# Patient Record
Sex: Female | Born: 1935
Health system: Southern US, Community
[De-identification: ages and names within clinical notes are randomized; demographics above are authoritative.]

## PROBLEM LIST (undated history)

## (undated) DIAGNOSIS — Z860100 Personal history of colon polyps, unspecified: Secondary | ICD-10-CM

## (undated) DIAGNOSIS — Z9849 Cataract extraction status, unspecified eye: Secondary | ICD-10-CM

## (undated) DIAGNOSIS — I4891 Unspecified atrial fibrillation: Secondary | ICD-10-CM

## (undated) DIAGNOSIS — I499 Cardiac arrhythmia, unspecified: Secondary | ICD-10-CM

## (undated) DIAGNOSIS — R0602 Shortness of breath: Secondary | ICD-10-CM

## (undated) DIAGNOSIS — I219 Acute myocardial infarction, unspecified: Secondary | ICD-10-CM

## (undated) DIAGNOSIS — K5730 Diverticulosis of large intestine without perforation or abscess without bleeding: Secondary | ICD-10-CM

## (undated) HISTORY — DX: Personal history of colon polyps, unspecified: Z86.0100

## (undated) HISTORY — PX: EYE SURGERY: SHX253

## (undated) HISTORY — DX: Cataract extraction status, unspecified eye: Z98.49

## (undated) HISTORY — DX: Unspecified atrial fibrillation: I48.91

## (undated) HISTORY — PX: VEIN LIGATION: SHX2652

## (undated) HISTORY — DX: Diverticulosis of large intestine without perforation or abscess without bleeding: K57.30

## (undated) HISTORY — DX: Personal history of colonic polyps: Z86.010

---

## 2000-06-18 ENCOUNTER — Encounter: Payer: Self-pay | Admitting: Obstetrics and Gynecology

## 2000-06-18 ENCOUNTER — Encounter: Admission: RE | Admit: 2000-06-18 | Discharge: 2000-06-18 | Payer: Self-pay | Admitting: Obstetrics and Gynecology

## 2001-07-07 ENCOUNTER — Encounter (INDEPENDENT_AMBULATORY_CARE_PROVIDER_SITE_OTHER): Payer: Self-pay | Admitting: Specialist

## 2001-07-07 ENCOUNTER — Ambulatory Visit (HOSPITAL_COMMUNITY): Admission: RE | Admit: 2001-07-07 | Discharge: 2001-07-07 | Payer: Self-pay | Admitting: Obstetrics and Gynecology

## 2001-07-30 ENCOUNTER — Encounter: Payer: Self-pay | Admitting: Obstetrics and Gynecology

## 2001-07-30 ENCOUNTER — Encounter: Admission: RE | Admit: 2001-07-30 | Discharge: 2001-07-30 | Payer: Self-pay | Admitting: Obstetrics and Gynecology

## 2001-11-17 ENCOUNTER — Other Ambulatory Visit: Admission: RE | Admit: 2001-11-17 | Discharge: 2001-11-17 | Payer: Self-pay | Admitting: Obstetrics and Gynecology

## 2001-11-26 ENCOUNTER — Encounter: Payer: Self-pay | Admitting: Obstetrics and Gynecology

## 2001-11-26 ENCOUNTER — Encounter: Admission: RE | Admit: 2001-11-26 | Discharge: 2001-11-26 | Payer: Self-pay | Admitting: Obstetrics and Gynecology

## 2002-09-09 ENCOUNTER — Encounter: Payer: Self-pay | Admitting: Obstetrics and Gynecology

## 2002-09-09 ENCOUNTER — Encounter: Admission: RE | Admit: 2002-09-09 | Discharge: 2002-09-09 | Payer: Self-pay | Admitting: Obstetrics and Gynecology

## 2002-11-25 ENCOUNTER — Other Ambulatory Visit: Admission: RE | Admit: 2002-11-25 | Discharge: 2002-11-25 | Payer: Self-pay | Admitting: Obstetrics and Gynecology

## 2003-09-26 ENCOUNTER — Encounter: Admission: RE | Admit: 2003-09-26 | Discharge: 2003-09-26 | Payer: Self-pay | Admitting: Obstetrics and Gynecology

## 2003-12-22 ENCOUNTER — Other Ambulatory Visit: Admission: RE | Admit: 2003-12-22 | Discharge: 2003-12-22 | Payer: Self-pay | Admitting: Obstetrics and Gynecology

## 2003-12-27 ENCOUNTER — Ambulatory Visit (HOSPITAL_COMMUNITY): Admission: RE | Admit: 2003-12-27 | Discharge: 2003-12-27 | Payer: Self-pay | Admitting: Gastroenterology

## 2003-12-27 ENCOUNTER — Encounter (INDEPENDENT_AMBULATORY_CARE_PROVIDER_SITE_OTHER): Payer: Self-pay | Admitting: *Deleted

## 2004-10-30 ENCOUNTER — Encounter: Admission: RE | Admit: 2004-10-30 | Discharge: 2004-10-30 | Payer: Self-pay | Admitting: Obstetrics and Gynecology

## 2005-11-04 ENCOUNTER — Encounter: Admission: RE | Admit: 2005-11-04 | Discharge: 2005-11-04 | Payer: Self-pay | Admitting: Obstetrics and Gynecology

## 2006-01-09 ENCOUNTER — Other Ambulatory Visit: Admission: RE | Admit: 2006-01-09 | Discharge: 2006-01-09 | Payer: Self-pay | Admitting: Obstetrics and Gynecology

## 2006-11-28 ENCOUNTER — Encounter: Admission: RE | Admit: 2006-11-28 | Discharge: 2006-11-28 | Payer: Self-pay | Admitting: Obstetrics and Gynecology

## 2007-12-01 ENCOUNTER — Encounter: Admission: RE | Admit: 2007-12-01 | Discharge: 2007-12-01 | Payer: Self-pay | Admitting: Obstetrics and Gynecology

## 2008-01-20 ENCOUNTER — Other Ambulatory Visit: Admission: RE | Admit: 2008-01-20 | Discharge: 2008-01-20 | Payer: Self-pay | Admitting: Obstetrics and Gynecology

## 2009-01-24 ENCOUNTER — Encounter: Admission: RE | Admit: 2009-01-24 | Discharge: 2009-01-24 | Payer: Self-pay | Admitting: Obstetrics and Gynecology

## 2009-10-21 DIAGNOSIS — I219 Acute myocardial infarction, unspecified: Secondary | ICD-10-CM

## 2009-10-21 HISTORY — DX: Acute myocardial infarction, unspecified: I21.9

## 2010-02-13 ENCOUNTER — Encounter: Admission: RE | Admit: 2010-02-13 | Discharge: 2010-02-13 | Payer: Self-pay | Admitting: Gastroenterology

## 2010-05-17 ENCOUNTER — Other Ambulatory Visit: Admission: RE | Admit: 2010-05-17 | Discharge: 2010-05-17 | Payer: Self-pay | Admitting: Obstetrics and Gynecology

## 2010-09-03 ENCOUNTER — Inpatient Hospital Stay (HOSPITAL_COMMUNITY): Admission: EM | Admit: 2010-09-03 | Discharge: 2010-09-07 | Payer: Self-pay | Admitting: Emergency Medicine

## 2010-09-06 ENCOUNTER — Encounter (INDEPENDENT_AMBULATORY_CARE_PROVIDER_SITE_OTHER): Payer: Self-pay | Admitting: Interventional Cardiology

## 2010-09-27 ENCOUNTER — Encounter (HOSPITAL_COMMUNITY)
Admission: RE | Admit: 2010-09-27 | Discharge: 2010-11-20 | Payer: Self-pay | Source: Home / Self Care | Attending: Interventional Cardiology | Admitting: Interventional Cardiology

## 2010-11-21 ENCOUNTER — Encounter (HOSPITAL_COMMUNITY): Payer: Medicare Other | Attending: Interventional Cardiology

## 2010-11-21 DIAGNOSIS — I4891 Unspecified atrial fibrillation: Secondary | ICD-10-CM | POA: Insufficient documentation

## 2010-11-21 DIAGNOSIS — Z7982 Long term (current) use of aspirin: Secondary | ICD-10-CM | POA: Insufficient documentation

## 2010-11-21 DIAGNOSIS — I509 Heart failure, unspecified: Secondary | ICD-10-CM | POA: Insufficient documentation

## 2010-11-21 DIAGNOSIS — I2582 Chronic total occlusion of coronary artery: Secondary | ICD-10-CM | POA: Insufficient documentation

## 2010-11-21 DIAGNOSIS — I2510 Atherosclerotic heart disease of native coronary artery without angina pectoris: Secondary | ICD-10-CM | POA: Insufficient documentation

## 2010-11-21 DIAGNOSIS — I079 Rheumatic tricuspid valve disease, unspecified: Secondary | ICD-10-CM | POA: Insufficient documentation

## 2010-11-21 DIAGNOSIS — Z7902 Long term (current) use of antithrombotics/antiplatelets: Secondary | ICD-10-CM | POA: Insufficient documentation

## 2010-11-21 DIAGNOSIS — I214 Non-ST elevation (NSTEMI) myocardial infarction: Secondary | ICD-10-CM | POA: Insufficient documentation

## 2010-11-21 DIAGNOSIS — I059 Rheumatic mitral valve disease, unspecified: Secondary | ICD-10-CM | POA: Insufficient documentation

## 2010-11-21 DIAGNOSIS — Z9861 Coronary angioplasty status: Secondary | ICD-10-CM | POA: Insufficient documentation

## 2010-11-21 DIAGNOSIS — Z87891 Personal history of nicotine dependence: Secondary | ICD-10-CM | POA: Insufficient documentation

## 2010-11-21 DIAGNOSIS — Z5189 Encounter for other specified aftercare: Secondary | ICD-10-CM | POA: Insufficient documentation

## 2010-11-23 ENCOUNTER — Encounter (HOSPITAL_COMMUNITY): Payer: Medicare Other

## 2010-11-26 ENCOUNTER — Encounter (HOSPITAL_COMMUNITY): Payer: Medicare Other

## 2010-11-28 ENCOUNTER — Encounter (HOSPITAL_COMMUNITY): Payer: Medicare Other

## 2010-11-30 ENCOUNTER — Encounter (HOSPITAL_COMMUNITY): Payer: Medicare Other

## 2010-12-03 ENCOUNTER — Encounter (HOSPITAL_COMMUNITY): Payer: Medicare Other

## 2010-12-05 ENCOUNTER — Encounter (HOSPITAL_COMMUNITY): Payer: Medicare Other

## 2010-12-07 ENCOUNTER — Encounter (HOSPITAL_COMMUNITY): Payer: Medicare Other

## 2010-12-10 ENCOUNTER — Encounter (HOSPITAL_COMMUNITY): Payer: Medicare Other

## 2010-12-12 ENCOUNTER — Encounter (HOSPITAL_COMMUNITY): Payer: Medicare Other

## 2010-12-14 ENCOUNTER — Encounter (HOSPITAL_COMMUNITY): Payer: Medicare Other

## 2010-12-17 ENCOUNTER — Encounter (HOSPITAL_COMMUNITY): Payer: Medicare Other

## 2010-12-19 ENCOUNTER — Encounter (HOSPITAL_COMMUNITY): Payer: Medicare Other

## 2010-12-21 ENCOUNTER — Encounter (HOSPITAL_COMMUNITY): Payer: Medicare Other | Attending: Interventional Cardiology

## 2010-12-21 DIAGNOSIS — Z7902 Long term (current) use of antithrombotics/antiplatelets: Secondary | ICD-10-CM | POA: Insufficient documentation

## 2010-12-21 DIAGNOSIS — I214 Non-ST elevation (NSTEMI) myocardial infarction: Secondary | ICD-10-CM | POA: Insufficient documentation

## 2010-12-21 DIAGNOSIS — Z87891 Personal history of nicotine dependence: Secondary | ICD-10-CM | POA: Insufficient documentation

## 2010-12-21 DIAGNOSIS — Z9861 Coronary angioplasty status: Secondary | ICD-10-CM | POA: Insufficient documentation

## 2010-12-21 DIAGNOSIS — I2582 Chronic total occlusion of coronary artery: Secondary | ICD-10-CM | POA: Insufficient documentation

## 2010-12-21 DIAGNOSIS — I2510 Atherosclerotic heart disease of native coronary artery without angina pectoris: Secondary | ICD-10-CM | POA: Insufficient documentation

## 2010-12-21 DIAGNOSIS — Z5189 Encounter for other specified aftercare: Secondary | ICD-10-CM | POA: Insufficient documentation

## 2010-12-21 DIAGNOSIS — Z7982 Long term (current) use of aspirin: Secondary | ICD-10-CM | POA: Insufficient documentation

## 2010-12-21 DIAGNOSIS — I509 Heart failure, unspecified: Secondary | ICD-10-CM | POA: Insufficient documentation

## 2010-12-21 DIAGNOSIS — I4891 Unspecified atrial fibrillation: Secondary | ICD-10-CM | POA: Insufficient documentation

## 2010-12-21 DIAGNOSIS — I059 Rheumatic mitral valve disease, unspecified: Secondary | ICD-10-CM | POA: Insufficient documentation

## 2010-12-21 DIAGNOSIS — I079 Rheumatic tricuspid valve disease, unspecified: Secondary | ICD-10-CM | POA: Insufficient documentation

## 2010-12-24 ENCOUNTER — Encounter (HOSPITAL_COMMUNITY): Payer: Medicare Other

## 2010-12-26 ENCOUNTER — Encounter (HOSPITAL_COMMUNITY): Payer: Medicare Other

## 2010-12-28 ENCOUNTER — Encounter (HOSPITAL_COMMUNITY): Payer: Medicare Other

## 2010-12-31 ENCOUNTER — Encounter (HOSPITAL_COMMUNITY): Payer: Medicare Other

## 2011-01-01 LAB — HEMOGLOBIN A1C: Mean Plasma Glucose: 117 mg/dL — ABNORMAL HIGH (ref <117)

## 2011-01-01 LAB — POCT CARDIAC MARKERS
CKMB, poc: 1.9 ng/mL (ref 1.0–8.0)
Myoglobin, poc: 110 ng/mL (ref 12–200)

## 2011-01-01 LAB — BASIC METABOLIC PANEL
BUN: 15 mg/dL (ref 6–23)
BUN: 5 mg/dL — ABNORMAL LOW (ref 6–23)
BUN: 6 mg/dL (ref 6–23)
BUN: 6 mg/dL (ref 6–23)
CO2: 28 mEq/L (ref 19–32)
Calcium: 8.3 mg/dL — ABNORMAL LOW (ref 8.4–10.5)
Calcium: 8.7 mg/dL (ref 8.4–10.5)
Chloride: 104 mEq/L (ref 96–112)
Chloride: 105 mEq/L (ref 96–112)
Creatinine, Ser: 0.7 mg/dL (ref 0.4–1.2)
GFR calc Af Amer: >60 mL/min (ref >60)
GFR calc non Af Amer: >60 mL/min (ref >60)
GFR calc non Af Amer: >60 mL/min (ref >60)
Glucose, Bld: 103 mg/dL — ABNORMAL HIGH (ref 70–99)
Glucose, Bld: 112 mg/dL — ABNORMAL HIGH (ref 70–99)
Glucose, Bld: 121 mg/dL — ABNORMAL HIGH (ref 70–99)
Potassium: 3.3 mEq/L — ABNORMAL LOW (ref 3.5–5.1)
Potassium: 3.8 mEq/L (ref 3.5–5.1)
Potassium: 4.6 mEq/L (ref 3.5–5.1)

## 2011-01-01 LAB — PROTIME-INR
INR: 1.04 (ref 0.00–1.49)
Prothrombin Time: 13.8 seconds (ref 11.6–15.2)

## 2011-01-01 LAB — LIPID PANEL
HDL: 58 mg/dL (ref >39)
Total CHOL/HDL Ratio: 2.3 RATIO
Triglycerides: 67 mg/dL (ref <150)
VLDL: 13 mg/dL (ref 0–40)

## 2011-01-01 LAB — CARDIAC PANEL(CRET KIN+CKTOT+MB+TROPI)
CK, MB: 5.6 ng/mL — ABNORMAL HIGH (ref 0.3–4.0)
Relative Index: 15.3 — ABNORMAL HIGH (ref 0.0–2.5)
Relative Index: 2 (ref 0.0–2.5)
Relative Index: 6.7 — ABNORMAL HIGH (ref 0.0–2.5)
Total CK: 275 U/L — ABNORMAL HIGH (ref 7–177)
Total CK: 379 U/L — ABNORMAL HIGH (ref 7–177)
Troponin I: 5.49 ng/mL (ref 0.00–0.06)
Troponin I: 6.87 ng/mL (ref 0.00–0.06)

## 2011-01-01 LAB — D-DIMER, QUANTITATIVE: D-Dimer, Quant: 0.4 ug/mL-FEU (ref 0.00–0.48)

## 2011-01-01 LAB — CBC
HCT: 37.1 % (ref 36.0–46.0)
HCT: 38.3 % (ref 36.0–46.0)
HCT: 38.6 % (ref 36.0–46.0)
HCT: 38.9 % (ref 36.0–46.0)
HCT: 40.7 % (ref 36.0–46.0)
Hemoglobin: 13 g/dL (ref 12.0–15.0)
MCH: 31.1 pg (ref 26.0–34.0)
MCH: 31.6 pg (ref 26.0–34.0)
MCH: 32 pg (ref 26.0–34.0)
MCH: 32 pg (ref 26.0–34.0)
MCHC: 32.9 g/dL (ref 30.0–36.0)
MCHC: 33.9 g/dL (ref 30.0–36.0)
MCV: 93.9 fL (ref 78.0–100.0)
MCV: 94.3 fL (ref 78.0–100.0)
MCV: 94.6 fL (ref 78.0–100.0)
MCV: 95.3 fL (ref 78.0–100.0)
MCV: 95.8 fL (ref 78.0–100.0)
Platelets: 174 10*3/uL (ref 150–400)
Platelets: 176 10*3/uL (ref 150–400)
RBC: 4.08 MIL/uL (ref 3.87–5.11)
RBC: 4.25 MIL/uL (ref 3.87–5.11)
RDW: 13 % (ref 11.5–15.5)
RDW: 13.1 % (ref 11.5–15.5)
RDW: 13.2 % (ref 11.5–15.5)
RDW: 13.2 % (ref 11.5–15.5)
WBC: 12.2 10*3/uL — ABNORMAL HIGH (ref 4.0–10.5)

## 2011-01-01 LAB — COMPREHENSIVE METABOLIC PANEL
Albumin: 3.7 g/dL (ref 3.5–5.2)
BUN: 20 mg/dL (ref 6–23)
CO2: 28 mEq/L (ref 19–32)
Chloride: 102 mEq/L (ref 96–112)
Creatinine, Ser: 0.88 mg/dL (ref 0.4–1.2)
GFR calc non Af Amer: >60 mL/min (ref >60)
Glucose, Bld: 184 mg/dL — ABNORMAL HIGH (ref 70–99)
Total Bilirubin: 0.5 mg/dL (ref 0.3–1.2)

## 2011-01-01 LAB — BRAIN NATRIURETIC PEPTIDE
Pro B Natriuretic peptide (BNP): 413 pg/mL — ABNORMAL HIGH (ref 0.0–100.0)
Pro B Natriuretic peptide (BNP): 717 pg/mL — ABNORMAL HIGH (ref 0.0–100.0)

## 2011-01-01 LAB — MRSA PCR SCREENING
MRSA by PCR: NEGATIVE
MRSA by PCR: NEGATIVE

## 2011-01-02 ENCOUNTER — Encounter (HOSPITAL_COMMUNITY): Payer: Medicare Other

## 2011-01-04 ENCOUNTER — Encounter (HOSPITAL_COMMUNITY): Payer: Medicare Other

## 2011-01-07 ENCOUNTER — Encounter (HOSPITAL_COMMUNITY): Payer: Medicare Other

## 2011-01-09 ENCOUNTER — Encounter (HOSPITAL_COMMUNITY): Payer: Medicare Other

## 2011-01-11 ENCOUNTER — Encounter (HOSPITAL_COMMUNITY): Payer: Medicare Other

## 2011-01-14 ENCOUNTER — Encounter (HOSPITAL_COMMUNITY): Payer: Medicare Other

## 2011-01-16 ENCOUNTER — Encounter (HOSPITAL_COMMUNITY): Payer: Medicare Other

## 2011-01-18 ENCOUNTER — Encounter (HOSPITAL_COMMUNITY): Payer: Medicare Other

## 2011-01-21 ENCOUNTER — Encounter (HOSPITAL_COMMUNITY): Payer: Medicare Other

## 2011-01-23 ENCOUNTER — Encounter (HOSPITAL_COMMUNITY): Payer: Medicare Other

## 2011-01-25 ENCOUNTER — Encounter (HOSPITAL_COMMUNITY): Payer: Medicare Other

## 2011-01-28 ENCOUNTER — Encounter (HOSPITAL_COMMUNITY): Payer: Medicare Other

## 2011-01-30 ENCOUNTER — Encounter (HOSPITAL_COMMUNITY): Payer: Medicare Other

## 2011-02-01 ENCOUNTER — Encounter (HOSPITAL_COMMUNITY): Payer: Medicare Other

## 2011-02-04 ENCOUNTER — Encounter (HOSPITAL_COMMUNITY): Payer: Medicare Other

## 2011-02-06 ENCOUNTER — Encounter (HOSPITAL_COMMUNITY): Payer: Medicare Other

## 2011-02-08 ENCOUNTER — Encounter (HOSPITAL_COMMUNITY): Payer: Medicare Other

## 2011-02-11 ENCOUNTER — Encounter (HOSPITAL_COMMUNITY): Payer: Medicare Other

## 2011-03-08 NOTE — H&P (Signed)
[facility]  Patient:    [name] Diaz, [name] Diaz Visit Number: [id number] MRN: [id number]          Service Type: Attending:  Esmeralda Arthur, M.D. Dictated by:   Esmeralda Arthur, M.D. Adm. Date:  07/06/01     History and Physical  OFFICE CHART:  #[id number]  HISTORY OF PRESENT ILLNESS:   This is a 77 year old female, para 2 whowas admitted to the hospital for a hysteroscopy, resection, and polypectomy.  This patient was seen on June 15, 2001, stating that she had vaginal bleeding in the middle of the cycle in July and in August.  The patient takes Premarin 0.625 mg q.12h., and Provera 10 mg days 1 through 10, and has had no problem with this before.  She had an examination which waswithin normal limits.  We scheduled her for a sonohysterogram which was done on June 16, 2001.  It showed her uterus was 6.9 cm x 6.7 cm x 5.8 cm.  She had an endometrial stripe of 13 mm, felt to have a polyp.  She had fibroids 2.4 cm x 3.1 cm, 1.3 cm x 1.4 cm, and 1.8 cm x 1.5 cm.  Her right ovary was 1.8 cm x 1.8 cm x 1.6 cm. Her left ovary was not seen.  Because she had a thick endometrium, she had saline injected, and she had a polyp 19.0 mm x 10.0 mm, and it looked like two polyps side by side.  Because of this the patient is admitted to the hospital for surgery.  ALLERGIES:                    No known drug allergies.  PAST MEDICAL HISTORY:         The last Pap smear was in January 2002.  Last mammogram was in August 2001.  Her last period was July 02, 2001, and she has regular periods which are heavy.  She has had no hot flushes.  She has had asthma in the 1980s, none now.  She has gotten her vaccinations.  SOCIAL HISTORY:               She does drink coffee.  CURRENT MEDICATIONS:          1. Premarin 0.625 mg q.12h.      2. Provera days 1 through 10.                               3. Caltrate.  REVIEW OF SYSTEMS:            Noncontributory.  She has had no  heart or lung problems.  FAMILY HISTORY:               Her mother has had a stroke in the past.  There is no family history of breast, colon, or ovarian cancer.  She has an aunt and first cousin with osteoporosis.  PHYSICAL EXAMINATION:  GENERAL:                      A well-developed, well-nourished female, oriented and alert.  VITAL SIGNS:                  Blood pressure 116/61.  Weight 153 pounds. Height 5 feet 7-1/4 inches.  NECK:  Her thyroid isnot palpable.  HEART:                        Normal sinus rhythm without gallops or murmurs.  LUNGS:                        Clear to percussion and auscultation.  ABDOMEN:                      Liver is not enlarged.  Spleen is not enlarged.  PELVIC:                       Vagina: She has good support.  Cervix is epithelialized.  Uterus feels slightlyenlarged.  Adnexa reveal no masses. Perineum is within normal limits.  IMPRESSION:                   1. Postmenopausal bleeding.                        2. Endometrial polyp, large.         3. Endometrial fibroids.                               4. Hormonereplacement therapy.  DISPOSITION:                  She is admitted for a hysteroscopy, D&C, and endometrial resection.  Dictated by:  Esmeralda Arthur, M.D. Attending:  Esmeralda Arthur, M.D. DD:  07/06/01 TD:  07/06/01 Job: [id number]

## 2011-03-08 NOTE — Op Note (Signed)
[facility]  Patient:    [name] Diaz, [name] Diaz Visit Number: [id number] MRN: [id number]          Service Type: DSU Location: [facility] Attending Physician:  Amanda Cockayne Dictated by:   Esmeralda Arthur, M.D. Proc. Date: 07/07/01 Admit Date:  07/07/2001                             Operative Report  PREOPERATIVE DIAGNOSIS:  POSTOPERATIVE DIAGNOSIS:  OPERATION:  SURGEON:                      Esmeralda Arthur, M.D.  ANESTHESIA:                   General.  PACKS:                        None.  CATHETERS:                    None.  MEDIUM:                       Sorbitol, deficit 260 cc.  DESCRIPTION OF FINDINGS:      The uterus sounded to 8 cm. She was dilated to a #25 and we could see polyps and a fibroid and dilate her to a #33 with the resectoscope being able to see two fibroids and could see a polyp. After we removed the fibroid, she had a large posterior polyp. All were resected and she had no excessive bleeding.  DESCRIPTION OF PROCEDURE:     The patient was carried to the operating room. After satisfactory anesthesia, the patient was placed in the lithotomy position. She was prepped and draped in sterile field. The bladder was emptied by catheterization.  Examination reviewed the anterior and felt a little enlarged. No mass felt in the adnexa.  A weighted speculum was placed in the posterior vagina. Cervix was grasped with a tenaculum and the uterus sounded to 8 cm. We then dilated her to a #25 and put in the observation scope, and I could see what I thought was a fibroid and could see a polyp anteriorly. We then dilated her to a #33 with no difficulty, insert the resectoscope and you could see two fibroids and the anterior polyp. When we removed the two fibroids, she had a very large posterior polyp and we resected this, too.  The _______ was set on 190, cautery was 120. We used Sorbitol as a medium. We ended up with a deficit  of 260 cc. We had a deficit of 60 cc before we started resecting.  The patient then had cautery done, decreasing the pressure, then we watched the patient for two minutes. She had no excessive bleeding. The procedure was terminated. She was carried to the recovery room in good condition. Dictated by:   Esmeralda Arthur, M.D. Attending Physician:  Amanda Cockayne DD:  07/07/01 TD:  07/07/01 Job: [id number]

## 2011-03-13 ENCOUNTER — Other Ambulatory Visit: Payer: Self-pay | Admitting: Obstetrics and Gynecology

## 2011-03-13 DIAGNOSIS — Z1231 Encounter for screening mammogram for malignant neoplasm of breast: Secondary | ICD-10-CM

## 2011-03-25 ENCOUNTER — Ambulatory Visit
Admission: RE | Admit: 2011-03-25 | Discharge: 2011-03-25 | Disposition: A | Discharge status: Home / Self Care | Payer: Medicare Other | Source: Ambulatory Visit | Attending: Obstetrics and Gynecology | Admitting: Obstetrics and Gynecology

## 2011-03-25 DIAGNOSIS — Z1231 Encounter for screening mammogram for malignant neoplasm of breast: Secondary | ICD-10-CM

## 2011-05-23 ENCOUNTER — Other Ambulatory Visit: Payer: Self-pay | Admitting: Nurse Practitioner

## 2011-05-23 ENCOUNTER — Other Ambulatory Visit (HOSPITAL_COMMUNITY)
Admission: RE | Admit: 2011-05-23 | Discharge: 2011-05-23 | Disposition: A | Discharge status: Home / Self Care | Payer: Medicare Other | Source: Ambulatory Visit | Attending: Obstetrics and Gynecology | Admitting: Obstetrics and Gynecology

## 2011-05-23 DIAGNOSIS — Z1159 Encounter for screening for other viral diseases: Secondary | ICD-10-CM | POA: Insufficient documentation

## 2011-05-23 DIAGNOSIS — Z124 Encounter for screening for malignant neoplasm of cervix: Secondary | ICD-10-CM | POA: Insufficient documentation

## 2012-03-23 ENCOUNTER — Other Ambulatory Visit: Payer: Self-pay | Admitting: Obstetrics and Gynecology

## 2012-03-23 DIAGNOSIS — Z1231 Encounter for screening mammogram for malignant neoplasm of breast: Secondary | ICD-10-CM

## 2012-04-01 ENCOUNTER — Ambulatory Visit
Admission: RE | Admit: 2012-04-01 | Discharge: 2012-04-01 | Disposition: A | Discharge status: Home / Self Care | Payer: Medicare Other | Source: Ambulatory Visit | Attending: Obstetrics and Gynecology | Admitting: Obstetrics and Gynecology

## 2012-04-01 DIAGNOSIS — Z1231 Encounter for screening mammogram for malignant neoplasm of breast: Secondary | ICD-10-CM

## 2012-06-23 ENCOUNTER — Other Ambulatory Visit: Payer: Self-pay | Admitting: Nurse Practitioner

## 2012-08-11 ENCOUNTER — Other Ambulatory Visit: Payer: Self-pay | Admitting: Obstetrics and Gynecology

## 2012-08-14 ENCOUNTER — Encounter (HOSPITAL_COMMUNITY): Payer: Self-pay | Admitting: Pharmacy Technician

## 2012-08-14 ENCOUNTER — Encounter (HOSPITAL_COMMUNITY)
Admission: RE | Admit: 2012-08-14 | Discharge: 2012-08-14 | Disposition: A | Discharge status: Home / Self Care | Payer: Medicare Other | Source: Ambulatory Visit | Attending: Obstetrics and Gynecology | Admitting: Obstetrics and Gynecology

## 2012-08-14 ENCOUNTER — Encounter (HOSPITAL_COMMUNITY): Payer: Self-pay

## 2012-08-14 HISTORY — DX: Cardiac arrhythmia, unspecified: I49.9

## 2012-08-14 HISTORY — DX: Acute myocardial infarction, unspecified: I21.9

## 2012-08-14 HISTORY — DX: Shortness of breath: R06.02

## 2012-08-14 LAB — CBC
MCH: 31 pg (ref 26.0–34.0)
MCHC: 32.6 g/dL (ref 30.0–36.0)
Platelets: 187 10*3/uL (ref 150–400)

## 2012-08-14 LAB — BASIC METABOLIC PANEL
BUN: 20 mg/dL (ref 6–23)
Calcium: 9.9 mg/dL (ref 8.4–10.5)
GFR calc non Af Amer: 80 mL/min — ABNORMAL LOW (ref >90)
Glucose, Bld: 96 mg/dL (ref 70–99)

## 2012-08-14 NOTE — Patient Instructions (Addendum)
Your procedure is scheduled on:08/25/12  Enter through the Main Entrance at :10 am Pick up desk phone and dial [phone] and inform us of your arrival.  Please call [phone] if you have any problems the morning of surgery.  Remember: Do not eat or drink after midnight:Monday   Take these meds the morning of surgery with a sip of water: Metoprolol  DO NOT wear jewelry, eye make-up, lipstick,body lotion, or dark fingernail polish. Do not shave for 48 hours prior to surgery.   Patients discharged on the day of surgery will not be allowed to drive home.

## 2012-08-25 ENCOUNTER — Encounter (HOSPITAL_COMMUNITY)
Admission: RE | Disposition: A | Discharge status: Home / Self Care | Payer: Self-pay | Source: Ambulatory Visit | Attending: Obstetrics and Gynecology

## 2012-08-25 ENCOUNTER — Encounter (HOSPITAL_COMMUNITY): Payer: Self-pay | Admitting: Anesthesiology

## 2012-08-25 ENCOUNTER — Ambulatory Visit (HOSPITAL_COMMUNITY)
Admission: RE | Admit: 2012-08-25 | Discharge: 2012-08-25 | Disposition: A | Discharge status: Home / Self Care | Payer: Medicare Other | Source: Ambulatory Visit | Attending: Obstetrics and Gynecology | Admitting: Obstetrics and Gynecology

## 2012-08-25 ENCOUNTER — Ambulatory Visit (HOSPITAL_COMMUNITY): Payer: Medicare Other | Admitting: Anesthesiology

## 2012-08-25 DIAGNOSIS — Z01812 Encounter for preprocedural laboratory examination: Secondary | ICD-10-CM | POA: Insufficient documentation

## 2012-08-25 DIAGNOSIS — Z01818 Encounter for other preprocedural examination: Secondary | ICD-10-CM | POA: Insufficient documentation

## 2012-08-25 DIAGNOSIS — N840 Polyp of corpus uteri: Secondary | ICD-10-CM | POA: Insufficient documentation

## 2012-08-25 DIAGNOSIS — N950 Postmenopausal bleeding: Secondary | ICD-10-CM | POA: Insufficient documentation

## 2012-08-25 DIAGNOSIS — Q514 Unicornate uterus: Secondary | ICD-10-CM | POA: Insufficient documentation

## 2012-08-25 HISTORY — PX: HYSTEROSCOPY W/D&C: SHX1775

## 2012-08-25 SURGERY — DILATATION AND CURETTAGE /HYSTEROSCOPY
Anesthesia: General | Site: Vagina | Wound class: Clean Contaminated

## 2012-08-25 MED ORDER — MIDAZOLAM HCL 2 MG/2ML IJ SOLN
INTRAMUSCULAR | Status: AC
  Filled 2012-08-25: qty 2

## 2012-08-25 MED ORDER — FENTANYL CITRATE 0.05 MG/ML IJ SOLN
INTRAMUSCULAR | Status: AC
  Filled 2012-08-25: qty 5

## 2012-08-25 MED ORDER — PHENYLEPHRINE 40 MCG/ML (10ML) SYRINGE FOR IV PUSH (FOR BLOOD PRESSURE SUPPORT)
PREFILLED_SYRINGE | INTRAVENOUS | Status: AC
  Filled 2012-08-25: qty 10

## 2012-08-25 MED ORDER — IBUPROFEN 600 MG PO TABS: 600.0000 mg | ORAL_TABLET | Freq: Four times a day (QID) | ORAL | Status: DC | PRN

## 2012-08-25 MED ORDER — CEFAZOLIN SODIUM-DEXTROSE 2-3 GM-% IV SOLR
INTRAVENOUS | Status: AC
  Filled 2012-08-25: qty 50

## 2012-08-25 MED ORDER — LIDOCAINE HCL (CARDIAC) 20 MG/ML IV SOLN
INTRAVENOUS | Status: AC
  Filled 2012-08-25: qty 5

## 2012-08-25 MED ORDER — SODIUM CHLORIDE 0.9 % IR SOLN
Status: DC | PRN
  Administered 2012-08-25: 3000 mL

## 2012-08-25 MED ORDER — FENTANYL CITRATE 0.05 MG/ML IJ SOLN
INTRAMUSCULAR | Status: DC | PRN
  Administered 2012-08-25 (×2): 50 ug via INTRAVENOUS

## 2012-08-25 MED ORDER — KETOROLAC TROMETHAMINE 60 MG/2ML IM SOLN
INTRAMUSCULAR | Status: AC
  Filled 2012-08-25: qty 2

## 2012-08-25 MED ORDER — PROPOFOL 10 MG/ML IV EMUL
INTRAVENOUS | Status: DC | PRN
  Administered 2012-08-25: 100 mg via INTRAVENOUS

## 2012-08-25 MED ORDER — FENTANYL CITRATE 0.05 MG/ML IJ SOLN
INTRAMUSCULAR | Status: AC
  Filled 2012-08-25: qty 2

## 2012-08-25 MED ORDER — ONDANSETRON HCL 4 MG/2ML IJ SOLN
INTRAMUSCULAR | Status: AC
  Filled 2012-08-25: qty 2

## 2012-08-25 MED ORDER — LACTATED RINGERS IV SOLN
INTRAVENOUS | Status: DC
  Administered 2012-08-25 (×3): via INTRAVENOUS

## 2012-08-25 MED ORDER — DEXAMETHASONE SODIUM PHOSPHATE 10 MG/ML IJ SOLN
INTRAMUSCULAR | Status: AC
  Filled 2012-08-25: qty 1

## 2012-08-25 MED ORDER — GLYCOPYRROLATE 0.2 MG/ML IJ SOLN
INTRAMUSCULAR | Status: AC
  Filled 2012-08-25: qty 2

## 2012-08-25 MED ORDER — CEFAZOLIN SODIUM-DEXTROSE 2-3 GM-% IV SOLR
2.0000 g | INTRAVENOUS | Status: AC
  Administered 2012-08-25: 2 g via INTRAVENOUS

## 2012-08-25 MED ORDER — PROPOFOL 10 MG/ML IV EMUL
INTRAVENOUS | Status: AC
  Filled 2012-08-25: qty 20

## 2012-08-25 MED ORDER — MISOPROSTOL 200 MCG PO TABS: ORAL_TABLET | ORAL | Status: DC

## 2012-08-25 MED ORDER — FERRIC SUBSULFATE SOLN
Status: DC | PRN
  Administered 2012-08-25: 1

## 2012-08-25 MED ORDER — FENTANYL CITRATE 0.05 MG/ML IJ SOLN: 25.0000 ug | INTRAMUSCULAR | Status: DC | PRN

## 2012-08-25 MED ORDER — ONDANSETRON HCL 4 MG/2ML IJ SOLN
INTRAMUSCULAR | Status: DC | PRN
  Administered 2012-08-25: 4 mg via INTRAVENOUS

## 2012-08-25 MED ORDER — NEOSTIGMINE METHYLSULFATE 1 MG/ML IJ SOLN
INTRAMUSCULAR | Status: AC
  Filled 2012-08-25: qty 10

## 2012-08-25 MED ORDER — PHENYLEPHRINE HCL 10 MG/ML IJ SOLN
INTRAMUSCULAR | Status: DC | PRN
  Administered 2012-08-25 (×7): 40 ug via INTRAVENOUS

## 2012-08-25 MED ORDER — LIDOCAINE HCL (CARDIAC) 20 MG/ML IV SOLN
INTRAVENOUS | Status: DC | PRN
  Administered 2012-08-25: 50 mg via INTRAVENOUS

## 2012-08-25 MED ORDER — LIDOCAINE HCL 2 % IJ SOLN
INTRAMUSCULAR | Status: AC
  Filled 2012-08-25: qty 20

## 2012-08-25 SURGICAL SUPPLY — 17 items
BLADE INCISOR TRUC PLUS 2.9 (ABLATOR) ×1 IMPLANT
CANISTER SUCTION 2500CC (MISCELLANEOUS) ×2 IMPLANT
CATH ROBINSON RED A/P 16FR (CATHETERS) ×2 IMPLANT
CLOTH BEACON ORANGE TIMEOUT ST (SAFETY) ×2 IMPLANT
CONTAINER PREFILL 10% NBF 60ML (FORM) ×4 IMPLANT
DILATOR CANAL MILEX (MISCELLANEOUS) ×2 IMPLANT
DRESSING TELFA 8X3 (GAUZE/BANDAGES/DRESSINGS) ×2 IMPLANT
GLOVE BIO SURGEON STRL SZ7 (GLOVE) ×2 IMPLANT
GLOVE BIOGEL PI IND STRL 7.0 (GLOVE) ×3 IMPLANT
GLOVE BIOGEL PI INDICATOR 7.0 (GLOVE) ×3
GOWN STRL REIN XL XLG (GOWN DISPOSABLE) ×6 IMPLANT
INCISOR TRUC PLUS BLADE 2.9 (ABLATOR) ×2
KIT HYSTEROSCOPY TRUCLEAR (ABLATOR) ×2 IMPLANT
PACK HYSTEROSCOPY LF (CUSTOM PROCEDURE TRAY) ×2 IMPLANT
PAD OB MATERNITY 4.3X12.25 (PERSONAL CARE ITEMS) ×2 IMPLANT
TOWEL OR 17X24 6PK STRL BLUE (TOWEL DISPOSABLE) ×4 IMPLANT
WATER STERILE IRR 1000ML POUR (IV SOLUTION) ×2 IMPLANT

## 2012-08-25 NOTE — Discharge Instructions (Addendum)
Hysteroscopy Hysteroscopy is a procedure used for looking inside the womb (uterus). It may be done for many different reasons, including:  To evaluate abnormal bleeding, fibroid (benign, noncancerous) tumors, polyps, scar tissue (adhesions), and possibly cancer of the uterus.  To look for lumps (tumors) and other uterine growths.  To look for causes of why a woman cannot get pregnant (infertility), causes of recurrent loss of pregnancy (miscarriages), or a lost intrauterine device (IUD).  To perform a sterilization by blocking the fallopian tubes from inside the uterus. A hysteroscopy should be done right after a menstrual period to be sure you are not pregnant. LET YOUR CAREGIVER KNOW ABOUT:   Allergies.  Medicines taken, including herbs, eyedrops, over-the-counter medicines, and creams.  Use of steroids (by mouth or creams).  Previous problems with anesthetics or numbing medicines.  History of bleeding or blood problems.  History of blood clots.  Possibility of pregnancy, if this applies.  Previous surgery.  Other health problems. RISKS AND COMPLICATIONS   Putting a hole in the uterus.  Excessive bleeding.  Infection.  Damage to the cervix.  Injury to other organs.  Allergic reaction to medicines.  Too much fluid used in the uterus for the procedure. BEFORE THE PROCEDURE   Do not take aspirin or blood thinners for a week before the procedure, or as directed. It can cause bleeding.  Arrive at least 60 minutes before the procedure or as directed to read and sign the necessary forms.  Arrange for someone to take you home after the procedure.  If you smoke, do not smoke for 2 weeks before the procedure. PROCEDURE   Your caregiver may give you medicine to relax you. He or she may also give you a medicine that numbs the area around the cervix (local anesthetic) or a medicine that makes you sleep (general anesthesia).  Sometimes, a medicine is placed in the cervix  the day before the procedure. This medicine makes the cervix have a larger opening (dilate). This makes it easier for the instrument to be inserted into the uterus.  A small instrument (hysteroscope) is inserted through the vagina into the uterus. This instrument is similar to a pencil-sized telescope with a light.  During the procedure, air or a liquid is put into the uterus, which allows the surgeon to see better.  Sometimes, tissue is gently scraped from inside the uterus. These tissue samples are sent to a specialist who looks at tissue samples (pathologist). The pathologist will give a report to your caregiver. This will help your caregiver decide if further treatment is necessary. The report will also help your caregiver decide on the best treatment if the test comes back abnormal. AFTER THE PROCEDURE   If you had a general anesthetic, you may be groggy for a couple hours after the procedure.  If you had a local anesthetic, you will be advised to rest at the surgical center or caregiver's office until you are stable and feel ready to go home.  You may have some cramping for a couple days.  You may have bleeding, which varies from light spotting for a few days to menstrual-like bleeding for up to 3 to 7 days. This is normal.  Have someone take you home. FINDING OUT THE RESULTS OF YOUR TEST Not all test results are available during your visit. If your test results are not back during the visit, make an appointment with your caregiver to find out the results. Do not assume everything is normal if you  have not heard from your caregiver or the medical facility. It is important for you to follow up on all of your test results. HOME CARE INSTRUCTIONS   Do not drive for 24 hours or as instructed.  Only take over-the-counter or prescription medicines for pain, discomfort, or fever as directed by your caregiver.  Do not take aspirin. It can cause or aggravate bleeding.  Do not drive or drink  alcohol while taking pain medicine.  You may resume your usual diet.  Do not use tampons, douche, or have sexual intercourse for 2 weeks, or as advised by your caregiver.  Rest and sleep for the first 24 to 48 hours.  Take your temperature twice a day for 4 to 5 days. Write it down. Give these temperatures to your caregiver if they are abnormal (above 98.6 F or 37.0 C).  Take medicines your caregiver has ordered as directed.  Follow your caregiver's advice regarding diet, exercise, lifting, driving, and general activities.  Take showers instead of baths for 2 weeks, or as recommended by your caregiver.  If you develop constipation:  Take a mild laxative with the advice of your caregiver.  Eat bran foods.  Drink enough water and fluids to keep your urine clear or pale yellow.  Try to have someone with you or available to you for the first 24 to 48 hours, especially if you had a general anesthetic.  Make sure you and your family understand everything about your operation and recovery.  Follow your caregiver's advice regarding follow-up appointments and Pap smears. SEEK MEDICAL CARE IF:   You feel dizzy or lightheaded.  You feel sick to your stomach (nauseous).  You develop abnormal vaginal discharge.  You develop a rash.  You have an abnormal reaction or allergy to your medicine.  You need stronger pain medicine. SEEK IMMEDIATE MEDICAL CARE IF:   Bleeding is heavier than a normal menstrual period or you have blood clots.  You have an oral temperature above 102 F (38.9 C), not controlled by medicine.  You have increasing cramps or pains not relieved with medicine.  You develop belly (abdominal) pain that does not seem to be related to the same area of earlier cramping and pain.  You pass out.  You develop pain in the tops of your shoulders (shoulder strap areas).  You develop shortness of breath. MAKE SURE YOU:   Understand these instructions.  Will watch  your condition.  Will get help right away if you are not doing well or get worse. Document Released: 01/13/2001 Document Revised: 12/30/2011 Document Reviewed: 05/08/2009 [facility] Patient Information 2013 [place], [place]. DISCHARGE INSTRUCTIONS: HYSTEROSCOPY / ENDOMETRIAL ABLATION The following instructions have been prepared to help you care for yourself upon your return home.  Personal hygiene:  Use sanitary pads for vaginal drainage, not tampons.  Shower the day after your procedure.  NO tub baths, pools or Jacuzzis for 2-3 weeks.  Wipe front to back after using the bathroom.  Activity and limitations:  Do NOT drive or operate any equipment for 24 hours. The effects of anesthesia are still present and drowsiness may result.  Do NOT rest in bed all day.  Walking is encouraged.  Walk up and down stairs slowly.  You may resume your normal activity in one to two days or as indicated by your physician. Sexual activity: NO intercourse for at least 2 weeks after the procedure, or as indicated by your Doctor.  Diet: Eat a light meal as desired this evening.  You may resume your usual diet tomorrow.  Return to Work: You may resume your work activities in one to two days or as indicated by Therapist, sports.  What to expect after your surgery: Expect to have vaginal bleeding/discharge for 2-3 days and spotting for up to 10 days. It is not unusual to have soreness for up to 1-2 weeks. You may have a slight burning sensation when you urinate for the first day. Mild cramps may continue for a couple of days. You may have a regular period in 2-6 weeks.  Call your doctor for any of the following:  Excessive vaginal bleeding or clotting, saturating and changing one pad every hour.  Inability to urinate 6 hours after discharge from hospital.  Pain not relieved by pain medication.  Fever of 100.4 F or greater.  Unusual vaginal discharge or odor.  Return to office  _________________Call for an appointment ___________________ Patients signature: ______________________ Nurses signature ________________________  Post Anesthesia Care Unit [phone] DISCHARGE INSTRUCTIONS: HYSTEROSCOPY / ENDOMETRIAL ABLATION The following instructions have been prepared to help you care for yourself upon your return home.  Personal hygiene:  Use sanitary pads for vaginal drainage, not tampons.  Shower the day after your procedure.  NO tub baths, pools or Jacuzzis for 2-3 weeks.  Wipe front to back after using the bathroom.  Activity and limitations:  Do NOT drive or operate any equipment for 24 hours. The effects of anesthesia are still present and drowsiness may result.  Do NOT rest in bed all day.  Walking is encouraged.  Walk up and down stairs slowly.  You may resume your normal activity in one to two days or as indicated by your physician. Sexual activity: NO intercourse for at least 2 weeks after the procedure, or as indicated by your Doctor.  Diet: Eat a light meal as desired this evening. You may resume your usual diet tomorrow.  Return to Work: You may resume your work activities in one to two days or as indicated by Therapist, sports.  What to expect after your surgery: Expect to have vaginal bleeding/discharge for 2-3 days and spotting for up to 10 days. It is not unusual to have soreness for up to 1-2 weeks. You may have a slight burning sensation when you urinate for the first day. Mild cramps may continue for a couple of days. You may have a regular period in 2-6 weeks.  Call your doctor for any of the following:  Excessive vaginal bleeding or clotting, saturating and changing one pad every hour.  Inability to urinate 6 hours after discharge from hospital.  Pain not relieved by pain medication.  Fever of 100.4 F or greater.  Unusual vaginal discharge or odor.  Return to office _________________Call for an appointment  ___________________ Patients signature: ______________________ Nurses signature ________________________  Post Anesthesia Care Unit [phone]

## 2012-08-25 NOTE — Transfer of Care (Signed)
Immediate Anesthesia Transfer of Care Note  Patient: [name] Diaz  Procedure(s) Performed: Procedure(s) (LRB) with comments: DILATATION AND CURETTAGE /HYSTEROSCOPY (N/A) - TruClear  Patient Location: PACU  Anesthesia Type:General  Level of Consciousness: awake, oriented and patient cooperative  Airway & Oxygen Therapy: Patient Spontanous Breathing and Patient connected to nasal cannula oxygen  Post-op Assessment: Report given to PACU RN and Post -op Vital signs reviewed and stable  Post vital signs: Reviewed and stable  Complications: No apparent anesthesia complications

## 2012-08-25 NOTE — Anesthesia Preprocedure Evaluation (Addendum)
Anesthesia Evaluation  Patient identified by MRN, date of birth, ID band Patient awake    Reviewed: Allergy & Precautions, H&P , NPO status , Patient's Chart, lab work & pertinent test results, reviewed documented beta blocker date and time   History of Anesthesia Complications Negative for: history of anesthetic complications  Airway Mallampati: II TM Distance: <3 FB Neck ROM: full    Dental  (+) Teeth Intact,    Pulmonary shortness of breath and with exertion, former smoker (quit 11 years),  breath sounds clear to auscultation  Pulmonary exam normal       Cardiovascular Exercise Tolerance: Good + Past MI (2011) and + Cardiac Stents (bare metal stent 2011) + dysrhythmias (rate controlled on metoprolol.  stopped xarelto 2 days ago) Atrial Fibrillation Rhythm:irregular Rate:Normal     Neuro/Psych negative neurological ROS  negative psych ROS   GI/Hepatic negative GI ROS, Neg liver ROS,   Endo/Other  negative endocrine ROS  Renal/GU negative Renal ROS  Female GU complaint (polyp)     Musculoskeletal   Abdominal   Peds  Hematology negative hematology ROS (+)   Anesthesia Other Findings   Reproductive/Obstetrics negative OB ROS                          Anesthesia Physical Anesthesia Plan  ASA: III  Anesthesia Plan: General LMA   Post-op Pain Management:    Induction:   Airway Management Planned:   Additional Equipment:   Intra-op Plan:   Post-operative Plan:   Informed Consent: I have reviewed the patients History and Physical, chart, labs and discussed the procedure including the risks, benefits and alternatives for the proposed anesthesia with the patient or authorized representative who has indicated his/her understanding and acceptance.   Dental Advisory Given  Plan Discussed with: CRNA and Surgeon  Anesthesia Plan Comments:        Anesthesia Quick Evaluation

## 2012-08-25 NOTE — Brief Op Note (Signed)
08/25/2012  12:51 PM  PATIENT:  [name] Diaz  78 y.o. female  PRE-OPERATIVE DIAGNOSIS:  Post menopausal bleeding/Endometrial Mass  POST-OPERATIVE DIAGNOSIS:  Endometrial polyp, unicornuate uterus  PROCEDURE:  Procedure(s) (LRB) with comments: DILATATION AND CURETTAGE /HYSTEROSCOPY (N/A) - TruClear  SURGEON:  Surgeon(s) and Role:    * Geryl Rankins, MD - Primary  PHYSICIAN ASSISTANT:   ASSISTANTS: Technician   ANESTHESIA:   general  EBL:  Total I/O In: 1400 [I.V.:1400] Out: 170 [Urine:150; Blood:20]  BLOOD ADMINISTERED:none  DRAINS: none   LOCAL MEDICATIONS USED:  NONE  SPECIMEN:  Source of Specimen:  Endocervical and endometrial currettings, Endometrial polyp in Trueclear bag  DISPOSITION OF SPECIMEN:  PATHOLOGY  COUNTS:  YES  TOURNIQUET:  * No tourniquets in log *  DICTATION: .Other Dictation: Dictation Number [id number]  PLAN OF CARE: Discharge to home after PACU  PATIENT DISPOSITION:  PACU - hemodynamically stable.   Delay start of Pharmacological VTE agent (>24hrs) due to surgical blood loss or risk of bleeding: not applicable

## 2012-08-25 NOTE — Interval H&P Note (Signed)
History and Physical Interval Note:  08/25/2012 11:24 AM  Lind Covert  has presented today for surgery, with the diagnosis of Post menopausal bleeding/Endometrial Mass  The various methods of treatment have been discussed with the patient and family. After consideration of risks, benefits and other options for treatment, the patient has consented to  Procedure(s) (LRB) with comments: DILATATION AND CURETTAGE /HYSTEROSCOPY (N/A) - TruClear as a surgical intervention .  The patient's history has been reviewed, patient examined, no change in status, stable for surgery.  I have reviewed the patient's chart and labs.  Questions were answered to the patient's satisfaction.    Pt without any complaints.  No bleeding.   CV:  RRR  [name] Diaz

## 2012-08-25 NOTE — H&P (Signed)
08/19/2012  Reason for Appointment  1. Preop for 08/24/12   History of Present Illness  General:  78 y/o G2P2 presents for preop for Hyst/D&C and removal of endometrial mass on 08/25/2009. Pt initially presented with complaint of postmenopausal bleeding while on anticoagulant tx (Xarelto). Ultrasound showed a ~2 cm mass, most likely c/w an endometrial polyp. She had an EMB which was showed benign endometrial tissue but it was complicated by hemorrhage which was thought to be due to resumption of Xarelto a few days after the biopsy. She has completed a course of 30 days of Provera. She stopped Provera 2 weeks ago and has not had any bleeding since. Breaking out with skin lesions since June. Broke out on back most recently around the time she started Provera. Pt is followed by [facility]. They have given preop clearance after seeing pt and recommendations on establishing coag window. Will stop anticoagulant 2 days prior to surgery. Will resume 4-5 days after procedure.   Current Medications  Caltrate 600+D 600-400 MG-UNIT Tablet 1 tablet Twice a day  Centrum Silver Tablet Chewable 1 tablet Once a day  Nitroglycerin 0.4 mg 0.4 mg tablet 1 tablet as directed as directed prn chest pain. Take every 5 minutes, up to 3 doses, if pain does not go away call 911 or go to ER.  Xarelto 20 mg 20 mg tablet 1 tablet as directed  Simvastatin 20 MG Tablet TAKE 1 TABLET AT BEDTIME   Diazepam 5 MG Tablet 0.5 tablet as needed  Metoprolol Tartrate 100 MG Tablet TAKE 1 TABLET BY MOUTH TWICE A DAY   Provera 10 MG Tablet 1 tablet at bedtime for 30 days  Lasix 20 MG Tablet 1 tablet Once a day  Diprolene AF 0.05 % Cream 1 application a thin film to affected area Once a day  Medication List reviewed and reconciled with the patient   Past Medical History  Atrial fibrillation  History of colon polyps  Colonic diverticulosis  Remote cigarette smoking  Cataract surgery with lens implant  Varicose vein stripping  in 1967  Postmenopausal  normal surveillance colonoscopy February 13, 2007. Repeat surveillance colonoscopy April 2013.  08/2010 anterior MI with bare metal stent to LAD   Surgical History  Denies Past Surgical History   Family History  Father: deceased 45 yrs   Mother: deceased 68 yrs   Brother 1: alive   Brother2: alive   denies any GYN family cancer hx.   Social History  General:  History of smoking cigarettes: Former smoker.  no Smoking, quit in 1999;smoking hx 42yrs.  Alcohol: yes, occasionally.  Caffeine: yes.  no Recreational drug use.  Exercise: walks, daily.  Occupation: retired, Runner, broadcasting/film/video.  Marital Status: widowed in 08/2009.  Children: Boys, 2.  issues with son regarding her husband's death.   Gyn History  Sexual activity not currently sexually active.  Periods : postmenopausal.  Last pap smear date 05/23/11, all negative.  Last mammogram date 04/01/12.  Abnormal pap smear none.  Denies H/O STD .  Menarche 14 to 72.  Denies H/O Genetic history .    OB History  Number of pregnancies 2.  Pregnancy # 1 live birth, vaginal delivery, boy.  Pregnancy # 2 live birth, vaginal delivery, boy.    Allergies  Clindamycin HCl: bowel incontinence   Hospitalization/Major Diagnostic Procedure  childbirth x 2   MI, [facility] 08/2010    Vital Signs  Wt 159, Wt change -1 lb, Ht 66.5, BMI 25.28, Pulse sitting 75, BP sitting  112/62.   Physical Examination  GENERAL:  Patient appears alert and oriented.  General Appearance: well-appearing, well-developed, no acute distress.  Speech: clear.  LUNGS:  General clear bilaterally, no crackles, no wheezes.  HEART:  Heart sounds: RRR, normal.  ABDOMEN:  General: no masses tenderness or organomegaly, soft, non distended, non tender.  FEMALE GENITOURINARY:  General deferred.  EXTREMITIES:  General: No edema, no calf tenderness.     Assessments   1. Pre-op exam - V72.84 (Primary)   2. Postmenopausal bleeding - 627.1   3. AF  (atrial fibrillation) - 427.31, On anticoagulant.   Treatment  1. Pre-op exam  Proceed with hysteroscopy/D&C. R/B/A discussed at length with pt. MRI could be an alternative but could not obtain a tissue sample and would be less definitive. Pt understands risk of anesthesia to include MI, Stroke but given cardiac clearance obtained by her cardiologist feel comfortable preceding with this minimal procedure. Discussed risk of uterine perforation and possible hemorrahage during or after procedure. Pt clearly understands indication for procedure. All questions answered.    Follow Up  2 Weeks post op

## 2012-08-25 NOTE — Anesthesia Postprocedure Evaluation (Signed)
Anesthesia Post Note  Patient: [name] Diaz  Procedure(s) Performed: Procedure(s) (LRB): DILATATION AND CURETTAGE /HYSTEROSCOPY (N/A)  Anesthesia type: General  Patient location: PACU  Post pain: Pain level controlled  Post assessment: Post-op Vital signs reviewed  Last Vitals:  Filed Vitals:   08/25/12 1345  BP: 111/83  Pulse: 70  Temp: 36.6 C  Resp: 16    Post vital signs: Reviewed  Level of consciousness: sedated  Complications: No apparent anesthesia complications

## 2012-08-26 ENCOUNTER — Encounter (HOSPITAL_COMMUNITY): Payer: Self-pay | Admitting: Obstetrics and Gynecology

## 2012-08-26 NOTE — Op Note (Signed)
Diaz Diaz NO.:  [id number]  MEDICAL RECORD NO.:  [id number]  LOCATION:  [facility]                          FACILITY:  [facility]  PHYSICIAN:  Pieter Partridge, MD   DATE OF BIRTH:  1934-06-07  DATE OF PROCEDURE:  08/25/2012 DATE OF DISCHARGE:  08/25/2012                              OPERATIVE REPORT   PREOPERATIVE DIAGNOSIS:  Postmenopausal bleeding and endometrial mass.  POSTOPERATIVE DIAGNOSIS:  Endometrial polyp and unicornuate uterus.  PROCEDURES:  Hysteroscopy, D and C with resection of endometrial polyp via TRUCLEAR device.  SURGEON:  Pieter Partridge, MD  ASSISTANT:  Technician.  ANESTHESIA:  General.  IV FLUIDS:  1400.  URINE:  150 out prior to procedure.  BLOOD:  Minimal of 20.  LOCAL MEDICATIONS USED:  None.  SPECIMENS:  Endocervical and endometrial curettings and endometrial polyp in TRUCLEAR bag to Pathology.  DISPOSITION:  To PACU hemodynamically stable.  COMPLICATIONS:  None.  FINDINGS:  A large endometrial polyp that was pedunculated and appeared soft, occupied the majority of the endometrial cavity.  Endometrial cavity was narrow.  There was also either a cervical polyp or cervical cyst right near the internal os.  Only 1 ostia was visualized on the patient's left.  No right cornu was identified.  Atrophic mucosa at the end of the procedure, was mildly friable.  PROCEDURE IN DETAIL:  Diaz Diaz was taken to the operating room, placed in the dorsal supine position, where she underwent general endotracheal anesthesia without complication.  She was then placed in the dorsal lithotomy position and prepped and draped in a normal sterile fashion.  Graves speculum was inserted into the vagina.  The patient was then placed in Trendelenburg.  The anterior lip of the cervix was identified and grasped with the single-tooth tenaculum.  Lidocaine was not used to minimize puncture sites due to the patient's history of anticoagulation, she  will be resuming that in a few days.  The cervix was dilated up to a 17-French.  The hysteroscope was initially advanced and the os finder was used for additional dilatation. The hysteroscope was then advanced into the endometrial cavity.  I could not see past the polyp so the TRUCLEAR device was advanced and calibrated and activated.  The endometrial polyp was then successfully removed.  Once removed, I was able to see the remainder of the cavity and there was what appeared to be normal endometrium, but more than I would have anticipated given the patient's age and menopausal status and also that she had been on Provera for 30 days.  I used the TRUCLEAR device to shave down the endometrium.  The ostia was finally identified and again I could not see a right ostia.  The TRUCLEAR device and the hysteroscope was then removed and sharp curettage of all 4 quadrants of the uterus was performed delicately.  The hysteroscope was then advanced and no perforation noted and no active bleeding seen and again I could not identify a right tract, right horn.  So all instruments were removed from the cervix.  The single-tooth tenaculum site was seen but no bleeding noted, but due to her history of hemorrhage after  her coagulation was resumed, the areas were treated, painted with Monsel's. The cervical os was observed and there was no active bleeding or any oozing noted.  During the procedure, the inflow tubing dislodged twice and the fluid was displaced on the floor.  It was thought that the inflow tubing was being disconnected due to twisting of the tubing and that resolved.  The deficit was approximately 190.  Otherwise all technical or all devices operated without issue.  The patient had SCDs on prior to induction of anesthesia.  She received IV antibiotics prior to sedation.  All instrument, sponge, and needle counts were correct x3, and she was taken to the operating room with IV fluids  running.  Prior to the prep, I did do a bimanual exam to confirm an anteverted uterus approximately 8-9 weeks.  There did appear to be a little boil on the left in the suprapubic region left of midline.  It was slightly erythematous, slightly raised, but it appeared to be the beginnings of a boil.  Pictures were obtained of the procedure.     Pieter Partridge, MD     EBV/MEDQ  D:  08/25/2012  T:  08/26/2012  Job:  [id number]

## 2012-11-19 ENCOUNTER — Other Ambulatory Visit: Payer: Self-pay

## 2013-03-01 ENCOUNTER — Other Ambulatory Visit: Payer: Self-pay

## 2013-03-01 DIAGNOSIS — Z1231 Encounter for screening mammogram for malignant neoplasm of breast: Secondary | ICD-10-CM

## 2013-03-02 ENCOUNTER — Other Ambulatory Visit: Payer: Self-pay

## 2013-04-12 ENCOUNTER — Ambulatory Visit: Payer: Medicare Other

## 2013-05-04 ENCOUNTER — Ambulatory Visit: Payer: Medicare Other

## 2013-05-20 ENCOUNTER — Ambulatory Visit
Admission: RE | Admit: 2013-05-20 | Discharge: 2013-05-20 | Disposition: A | Discharge status: Home / Self Care | Payer: Medicare Other | Source: Ambulatory Visit

## 2013-05-20 DIAGNOSIS — Z1231 Encounter for screening mammogram for malignant neoplasm of breast: Secondary | ICD-10-CM

## 2013-07-13 ENCOUNTER — Encounter (INDEPENDENT_AMBULATORY_CARE_PROVIDER_SITE_OTHER): Payer: Medicare Other | Admitting: Ophthalmology

## 2013-07-13 DIAGNOSIS — H43819 Vitreous degeneration, unspecified eye: Secondary | ICD-10-CM

## 2013-07-13 DIAGNOSIS — H26499 Other secondary cataract, unspecified eye: Secondary | ICD-10-CM

## 2013-07-13 DIAGNOSIS — H3530 Unspecified macular degeneration: Secondary | ICD-10-CM

## 2013-07-20 ENCOUNTER — Encounter (INDEPENDENT_AMBULATORY_CARE_PROVIDER_SITE_OTHER): Payer: Medicare Other | Admitting: Ophthalmology

## 2013-07-20 DIAGNOSIS — H2700 Aphakia, unspecified eye: Secondary | ICD-10-CM

## 2013-08-09 ENCOUNTER — Other Ambulatory Visit: Payer: Self-pay | Admitting: Interventional Cardiology

## 2013-11-03 ENCOUNTER — Telehealth: Payer: Self-pay | Admitting: Interventional Cardiology

## 2013-11-03 NOTE — Telephone Encounter (Signed)
New message     Ins will no longer pay for xarelto-----Can she take something elso or can Dr V appeal to the ins company

## 2013-11-03 NOTE — Telephone Encounter (Signed)
Spoke with pt and insurance company will be faxing over PA today.

## 2013-11-03 NOTE — Telephone Encounter (Signed)
Kim have we received a PA for this pts Xarelto?

## 2013-11-09 NOTE — Progress Notes (Signed)
Patient was approved for xarelto through 1.14.16. ID # [id number]   GPI/NDC [id number]

## 2013-11-10 ENCOUNTER — Encounter: Payer: Self-pay | Admitting: *Deleted

## 2013-11-10 NOTE — Telephone Encounter (Signed)
This encounter was created in error - please disregard.

## 2013-11-11 NOTE — Telephone Encounter (Signed)
Completed and approved see Greta Doomose Jacobs note

## 2013-11-11 NOTE — Telephone Encounter (Signed)
lmom with update of approval

## 2013-11-11 NOTE — Telephone Encounter (Signed)
Kim, do you have PA on this pt. I don't mind doing the PA; I just need the form.

## 2013-11-14 ENCOUNTER — Other Ambulatory Visit: Payer: Self-pay | Admitting: *Deleted

## 2013-11-14 DIAGNOSIS — I4891 Unspecified atrial fibrillation: Secondary | ICD-10-CM

## 2013-11-14 DIAGNOSIS — Z79899 Other long term (current) drug therapy: Secondary | ICD-10-CM

## 2013-11-16 ENCOUNTER — Other Ambulatory Visit: Payer: Self-pay | Admitting: Pharmacist

## 2013-11-16 MED ORDER — RIVAROXABAN 20 MG PO TABS: 20.0000 mg | ORAL_TABLET | Freq: Every day | ORAL | Status: DC

## 2013-12-20 ENCOUNTER — Other Ambulatory Visit (INDEPENDENT_AMBULATORY_CARE_PROVIDER_SITE_OTHER): Payer: Medicare Other

## 2013-12-20 DIAGNOSIS — Z79899 Other long term (current) drug therapy: Secondary | ICD-10-CM

## 2013-12-20 DIAGNOSIS — I4891 Unspecified atrial fibrillation: Secondary | ICD-10-CM

## 2013-12-20 LAB — HEMOGLOBIN AND HEMATOCRIT, BLOOD
HEMATOCRIT: 40.2 % (ref 36.0–46.0)
Hemoglobin: 13.2 g/dL (ref 12.0–15.0)

## 2013-12-20 LAB — RENAL FUNCTION PANEL
ALBUMIN: 3.9 g/dL (ref 3.5–5.2)
BUN: 28 mg/dL — AB (ref 6–23)
CHLORIDE: 102 meq/L (ref 96–112)
CO2: 30 meq/L (ref 19–32)
Calcium: 9.7 mg/dL (ref 8.4–10.5)
Creatinine, Ser: 0.8 mg/dL (ref 0.4–1.2)
GFR: 78.32 mL/min (ref >60.00)
Glucose, Bld: 81 mg/dL (ref 70–99)
PHOSPHORUS: 3.8 mg/dL (ref 2.3–4.6)
POTASSIUM: 3.7 meq/L (ref 3.5–5.1)
Sodium: 140 mEq/L (ref 135–145)

## 2013-12-21 ENCOUNTER — Telehealth: Payer: Self-pay | Admitting: Cardiology

## 2013-12-21 DIAGNOSIS — I4891 Unspecified atrial fibrillation: Secondary | ICD-10-CM

## 2013-12-21 NOTE — Telephone Encounter (Signed)
Pt aware. 6 mth f/u lab scheduled.

## 2013-12-21 NOTE — Telephone Encounter (Signed)
Message copied by Theda SersSTEGALL, Koi Yarbro H on Tue Dec 21, 2013  3:41 PM ------      Message from: SMART, Gaspar SkeetersJEREMY G      Created: Mon Dec 20, 2013  5:16 PM       Continue Xarelto 20 mg qd. Recheck BMET and CBC in six months. ------

## 2014-03-29 ENCOUNTER — Ambulatory Visit (INDEPENDENT_AMBULATORY_CARE_PROVIDER_SITE_OTHER): Payer: Medicare Other | Admitting: Interventional Cardiology

## 2014-03-29 ENCOUNTER — Encounter (INDEPENDENT_AMBULATORY_CARE_PROVIDER_SITE_OTHER): Payer: Self-pay

## 2014-03-29 ENCOUNTER — Encounter: Payer: Self-pay | Admitting: Cardiology

## 2014-03-29 ENCOUNTER — Ambulatory Visit: Payer: Medicare Other | Admitting: Interventional Cardiology

## 2014-03-29 ENCOUNTER — Encounter: Payer: Self-pay | Admitting: Interventional Cardiology

## 2014-03-29 VITALS — BP 116/71 | HR 81 | Ht 67.5 in | Wt 161.2 lb

## 2014-03-29 DIAGNOSIS — R609 Edema, unspecified: Secondary | ICD-10-CM

## 2014-03-29 DIAGNOSIS — I5032 Chronic diastolic (congestive) heart failure: Secondary | ICD-10-CM | POA: Insufficient documentation

## 2014-03-29 DIAGNOSIS — I252 Old myocardial infarction: Secondary | ICD-10-CM | POA: Insufficient documentation

## 2014-03-29 DIAGNOSIS — I4891 Unspecified atrial fibrillation: Secondary | ICD-10-CM | POA: Insufficient documentation

## 2014-03-29 HISTORY — DX: Edema, unspecified: R60.9

## 2014-03-29 HISTORY — DX: Chronic diastolic (congestive) heart failure: I50.32

## 2014-03-29 HISTORY — DX: Old myocardial infarction: I25.2

## 2014-03-29 MED ORDER — NITROGLYCERIN 0.4 MG SL SUBL: 0.4000 mg | SUBLINGUAL_TABLET | SUBLINGUAL | Status: DC | PRN

## 2014-03-29 NOTE — Progress Notes (Signed)
Patient ID: [name] Diaz, female   DOB: April 09, 1934, 80 y.o.   MRN: [id number]    [address] [place], [place]  [address] Phone: [phone] Fax:  [phone]  Date:  03/29/2014   ID:  [name] Diaz, DOB Jul 03, 1934, MRN [id number]  PCP:  Charolett Bumpers, MD      History of Present Illness: [name] Diaz is a 80 y.o. female who has had AFib and MI. She has had a stroke secondary to atrial fibrillation in the past. She has several rashes which may be related to Provera and Xarelto. THis has improved. She follows with dermatology. Currently she feels well. No chest pain. Minimal shortness of breath but this does not limit her. No palpitations. No significant leg swelling on Lasix.    Chronic diastolic HF. Feels okay. Has some SHOB with exertion (walking up stairs or uphill) occasionally, no chest pain. Usually better if she stops for a second and rest. Denies dizziness, syncope, palpitations, SHOB, chest pain, orthopnea, PND. Has some LE edema. Took an extra Lasix couple times this year. Notices that if she drinks wine often, her feet would swell more. No bleeding.  Exercises with the Silver Sneakers 3 days a week w/o SHOB or chest pain.   Tolerated her GYN procedure to remove polyp.     Wt Readings from Last 3 Encounters:  03/29/14 161 lb 3.2 oz (73.12 kg)  08/12/12 158 lb (71.668 kg)  08/12/12 158 lb (71.668 kg)     Past Medical History  Diagnosis Date  . Myocardial infarction 2011    DES TO LAD  . Dysrhythmia     Atrial fib  . Shortness of breath     on exertion  . A-fib   . History of colon polyps   . Colonic diverticular disease   . History of cataract surgery     with lens implant     Current Outpatient Prescriptions  Medication Sig Dispense Refill  . Calcium Carbonate-Vitamin D (CALTRATE 600+D) 600-400 MG-UNIT per tablet Take 1 tablet by mouth 2 (two) times daily.      . Carboxymethylcellul-Glycerin 0.5-0.9 % SOLN Apply to eye once a week.      . diazepam  (VALIUM) 5 MG tablet Take 5 mg by mouth at bedtime as needed. For anxiety      . fish oil-omega-3 fatty acids 1000 MG capsule Take 3 g by mouth daily.      . furosemide (LASIX) 20 MG tablet TAKE 1 TABLET BY MOUTH EVERY DAY  30 tablet  6  . hydrocortisone cream 1 % Apply 1 application topically 2 (two) times daily as needed. For insect bites      . metoprolol (LOPRESSOR) 100 MG tablet Take 100 mg by mouth 2 (two) times daily.      . Multiple Vitamin (MULTIVITAMIN WITH MINERALS) TABS Take 1 tablet by mouth daily.      . nitroGLYCERIN (NITROSTAT) 0.4 MG SL tablet Place 1 tablet (0.4 mg total) under the tongue every 5 (five) minutes as needed for chest pain.  25 tablet  5  . Rivaroxaban (XARELTO) 20 MG TABS tablet Take 1 tablet (20 mg total) by mouth daily with supper.  30 tablet    . simvastatin (ZOCOR) 20 MG tablet Take 20 mg by mouth at bedtime.       No current facility-administered medications for this visit.    Allergies:    Allergies  Allergen Reactions  . Clindamycin/Lincomycin Diarrhea  Social History:  The patient  reports that she has quit smoking. She does not have any smokeless tobacco history on file. She reports that she does not drink alcohol or use illicit drugs.   Family History:  The patient's family history includes CVA in her mother.   ROS:  Please see the history of present illness.  No nausea, vomiting.  No fevers, chills.  No focal weakness.  No dysuria. Occasional lower extremity edema. Varicose veins.  All other systems reviewed and negative.   PHYSICAL EXAM: VS:  BP 116/71  Pulse 81  Ht 5' 7.5" (1.715 m)  Wt 161 lb 3.2 oz (73.12 kg)  BMI 24.86 kg/m2 Well nourished, well developed, in no acute distress HEENT: normal Neck: no JVD, no carotid bruits Cardiac:  normal S1, S2; irregularly irregular Lungs:  clear to auscultation bilaterally, no wheezing, rhonchi or rales Abd: soft, nontender, no hepatomegaly Ext: no edema Skin: warm and dry Neuro:   no focal  abnormalities noted  EKG:    Atrial fibrillation, rate controlled  ASSESSMENT AND PLAN:  Atrial fibrillation  Continue Metoprolol Tartrate Tablet, 100 MG, TAKE 1 TABLET BY MOUTH TWICE A DAY Notes: Rate controlled. Xarelto for stroke prevention. Discussed eliquis due to nosebleeds. No recent nosebleeds. WIll continue Xarelto for now.    2. Old myocardial infarction  Continue Simvastatin Tablet, 20 MG, TAKE 1 TABLET AT BEDTIME Continue Nitroglycerin 0.4 mg tablet, 0.4 mg, 1 tablet as directed, SL, as directed prn chest pain. Take every 5 minutes, up to 3 doses, if pain does not go away call 911 or go to ER. Notes: No CHF. DES in the LAD.    3. Chronic diastolic heart failure  Continue Lasix Tablet, 20 MG, 1 tablet, Orally, Once a day Notes: Likely caused by age and AFib. Now taking Lasix daily. She notices a difference if she skips a day.  LE edema: likely related to venous insufficiency.   Preventive Medicine  Adult topics discussed:  Diet: healthy diet.  Exercise: 5 days a week,at least 30 minutes of aerobic exercise.    Follow Up  1 Year (Reason: AFib, CAD)   .  Signed, Fredric MareJay S. Varanasi, MD, [facility] 03/29/2014 1:40 PM

## 2014-03-29 NOTE — Patient Instructions (Signed)
Your physician recommends that you continue on your current medications as directed. Please refer to the Current Medication list given to you today.  Your physician wants you to follow-up in: 1 year with Dr. Varanasi. You will receive a reminder letter in the mail two months in advance. If you don't receive a letter, please call our office to schedule the follow-up appointment.  

## 2014-04-07 ENCOUNTER — Other Ambulatory Visit: Payer: Self-pay

## 2014-04-07 ENCOUNTER — Telehealth: Payer: Self-pay

## 2014-04-07 MED ORDER — METOPROLOL TARTRATE 100 MG PO TABS: 100.0000 mg | ORAL_TABLET | Freq: Two times a day (BID) | ORAL | Status: DC

## 2014-04-07 MED ORDER — FUROSEMIDE 20 MG PO TABS: ORAL_TABLET | ORAL | Status: DC

## 2014-04-07 NOTE — Telephone Encounter (Signed)
Error

## 2014-04-14 ENCOUNTER — Other Ambulatory Visit: Payer: Self-pay

## 2014-04-14 DIAGNOSIS — Z1231 Encounter for screening mammogram for malignant neoplasm of breast: Secondary | ICD-10-CM

## 2014-04-26 ENCOUNTER — Other Ambulatory Visit: Payer: Self-pay | Admitting: *Deleted

## 2014-04-26 MED ORDER — RIVAROXABAN 20 MG PO TABS: 20.0000 mg | ORAL_TABLET | Freq: Every day | ORAL | Status: DC

## 2014-05-23 ENCOUNTER — Ambulatory Visit
Admission: RE | Admit: 2014-05-23 | Discharge: 2014-05-23 | Disposition: A | Discharge status: Home / Self Care | Payer: Medicare Other | Source: Ambulatory Visit

## 2014-05-23 DIAGNOSIS — Z1231 Encounter for screening mammogram for malignant neoplasm of breast: Secondary | ICD-10-CM

## 2014-06-02 ENCOUNTER — Telehealth: Payer: Self-pay | Admitting: Interventional Cardiology

## 2014-06-02 NOTE — Telephone Encounter (Signed)
Pt notified. Pt will call her PCP.

## 2014-06-02 NOTE — Telephone Encounter (Signed)
Jeremy, please advise.  

## 2014-06-02 NOTE — Telephone Encounter (Signed)
Should not be from metoprolol as she would have had this problem years ago.  Please notify patient.

## 2014-06-02 NOTE — Telephone Encounter (Signed)
New message      Pt has been on metoprolol for several years.  She has had diarrhea for 7-8 days straight.  Could this be caused by the medication?

## 2014-06-03 ENCOUNTER — Other Ambulatory Visit: Payer: Self-pay | Admitting: *Deleted

## 2014-06-03 MED ORDER — SIMVASTATIN 20 MG PO TABS: 20.0000 mg | ORAL_TABLET | Freq: Every day | ORAL | Status: DC

## 2014-06-24 ENCOUNTER — Other Ambulatory Visit: Payer: Medicare Other

## 2014-07-04 ENCOUNTER — Other Ambulatory Visit (INDEPENDENT_AMBULATORY_CARE_PROVIDER_SITE_OTHER): Payer: Medicare Other

## 2014-07-04 DIAGNOSIS — I4819 Other persistent atrial fibrillation: Secondary | ICD-10-CM

## 2014-07-04 DIAGNOSIS — I4891 Unspecified atrial fibrillation: Secondary | ICD-10-CM

## 2014-07-04 LAB — CBC
HCT: 41 % (ref 36.0–46.0)
HEMOGLOBIN: 13.5 g/dL (ref 12.0–15.0)
MCHC: 33 g/dL (ref 30.0–36.0)
MCV: 95.1 fl (ref 78.0–100.0)
PLATELETS: 153 10*3/uL (ref 150.0–400.0)
RBC: 4.31 Mil/uL (ref 3.87–5.11)
RDW: 14.7 % (ref 11.5–15.5)
WBC: 11.8 10*3/uL — AB (ref 4.0–10.5)

## 2014-07-04 LAB — BASIC METABOLIC PANEL
BUN: 19 mg/dL (ref 6–23)
CALCIUM: 9.4 mg/dL (ref 8.4–10.5)
CO2: 30 mEq/L (ref 19–32)
Chloride: 100 mEq/L (ref 96–112)
Creatinine, Ser: 0.8 mg/dL (ref 0.4–1.2)
GFR: 73.72 mL/min (ref >60.00)
Glucose, Bld: 92 mg/dL (ref 70–99)
POTASSIUM: 3.9 meq/L (ref 3.5–5.1)
SODIUM: 140 meq/L (ref 135–145)

## 2014-07-06 ENCOUNTER — Encounter: Payer: Self-pay | Admitting: Cardiology

## 2014-07-06 ENCOUNTER — Other Ambulatory Visit: Payer: Self-pay | Admitting: Cardiology

## 2014-07-06 DIAGNOSIS — Z7901 Long term (current) use of anticoagulants: Secondary | ICD-10-CM

## 2014-09-26 ENCOUNTER — Other Ambulatory Visit (HOSPITAL_COMMUNITY)
Admission: RE | Admit: 2014-09-26 | Discharge: 2014-09-26 | Disposition: A | Discharge status: Home / Self Care | Payer: Medicare Other | Source: Ambulatory Visit | Attending: Obstetrics and Gynecology | Admitting: Obstetrics and Gynecology

## 2014-09-26 ENCOUNTER — Other Ambulatory Visit: Payer: Self-pay | Admitting: Obstetrics and Gynecology

## 2014-09-26 DIAGNOSIS — Z1151 Encounter for screening for human papillomavirus (HPV): Secondary | ICD-10-CM | POA: Insufficient documentation

## 2014-09-26 DIAGNOSIS — Z124 Encounter for screening for malignant neoplasm of cervix: Secondary | ICD-10-CM | POA: Insufficient documentation

## 2014-09-27 LAB — CYTOLOGY - PAP

## 2014-10-07 ENCOUNTER — Other Ambulatory Visit: Payer: Self-pay

## 2014-10-07 DIAGNOSIS — I863 Vulval varices: Secondary | ICD-10-CM

## 2014-10-07 DIAGNOSIS — I8390 Asymptomatic varicose veins of unspecified lower extremity: Secondary | ICD-10-CM

## 2014-10-24 DIAGNOSIS — I83899 Varicose veins of unspecified lower extremities with other complications: Secondary | ICD-10-CM | POA: Insufficient documentation

## 2014-10-24 HISTORY — DX: Varicose veins of unspecified lower extremity with other complications: I83.899

## 2014-10-25 ENCOUNTER — Telehealth: Payer: Self-pay | Admitting: Interventional Cardiology

## 2014-10-25 NOTE — Telephone Encounter (Signed)
New message    Patient  pending vascular procedure - no date set .     Has general questions to ask the MD .

## 2014-10-25 NOTE — Telephone Encounter (Signed)
Patient has bleeding vulvar varicosities. She will be [facility]  [facility] Dr. Ave Filteragan for this. He will be doing a pelvic venography because he thinks she has a blockage in her pelvic veins. Sounds like they will be going through her subclavian vein.  She just wants you to know this because of her being on Xarelto. Doesn't think she needs to stop it.

## 2014-10-27 NOTE — Telephone Encounter (Signed)
Follow up    Patient calling back would like an opinion - upcoming procedure. Asking for a call back today.

## 2014-10-27 NOTE — Telephone Encounter (Signed)
Attempted to call the patient back. It's unclear from the previous note if the patient will need a clearance for the pending procedure. Will forward to Dr. Eldridge DaceVaranasi for review and to see if the patient would be ok to hold Xarelto if needed and if so, for how long.

## 2014-10-27 NOTE — Telephone Encounter (Signed)
The patient is aware of Dr. Hoyle BarrVaranasi's recommendations. She states the performing surgeon recommends holding Xarelto the night prior to her procedure and then the night of the procedure. I advised this is fine and that we typically leave it up to the surgeon to restart anticoagulation when they feel it is safe for the patient to do so. She voices understanding.

## 2014-10-27 NOTE — Telephone Encounter (Signed)
No further cardiac testing needed before procedure.  If MD performing procedure wants her off of Xarelto, it is ok to stop for 2 days prior.  I do not know the bleeding risks of the procedure so I would defer to the performing MDs opinion.

## 2014-11-02 ENCOUNTER — Other Ambulatory Visit: Payer: Self-pay | Admitting: Interventional Cardiology

## 2014-12-02 ENCOUNTER — Other Ambulatory Visit: Payer: Self-pay | Admitting: Interventional Cardiology

## 2014-12-05 ENCOUNTER — Other Ambulatory Visit: Payer: Self-pay | Admitting: Nurse Practitioner

## 2014-12-05 MED ORDER — SIMVASTATIN 20 MG PO TABS: 20.0000 mg | ORAL_TABLET | Freq: Every day | ORAL | Status: DC

## 2014-12-13 ENCOUNTER — Encounter (HOSPITAL_COMMUNITY): Payer: Medicare Other

## 2014-12-13 ENCOUNTER — Encounter: Payer: Medicare Other | Admitting: Vascular Surgery

## 2014-12-16 ENCOUNTER — Telehealth: Payer: Self-pay | Admitting: Interventional Cardiology

## 2014-12-16 NOTE — Telephone Encounter (Signed)
OK to increase lasix to 40 mg daily. BMet 1 week later

## 2014-12-16 NOTE — Telephone Encounter (Signed)
New message     Pt c/o medication issue:  1. Name of Medication: furosemide 2. How are you currently taking this medication (dosage and times per day)? 20mg  daily  3. Are you having a reaction (difficulty breathing--STAT)?  no  4. What is your medication issue?  Pt says her legs, ankles and fingers are still swelling.  She thinks she needs to take 40mg  daily.  Every now and then she takes 40mg s and it seems to work.  If ok to take 40mg ---will need [facility] pres[facility] [address]-----Ok to leave msg----pt lives alone.

## 2014-12-16 NOTE — Telephone Encounter (Signed)
Pt called and stated that she has noticed more swelling in both legs and ankles. Pt states that she has noticed this in the last two months and has gained 4-5lbs. Pt states that she is not having any shortness of breath.  Pt states that she use to be on Lasix 40mg  a day but it had been lowered to 20mg  daily. Pt wanted to increase her dose back to 40mg  a day if Dr. Eldridge DaceVaranasi was in agreement with this. Informed pt that Dr. Eldridge DaceVaranasi was out of the office today but that I would forward him this information to review and advisement. Pt verbalized understanding and was in agreement with this plan.

## 2014-12-16 NOTE — Telephone Encounter (Signed)
Left message to call back  

## 2014-12-19 MED ORDER — FUROSEMIDE 20 MG PO TABS: ORAL_TABLET | ORAL | Status: DC

## 2014-12-19 NOTE — Telephone Encounter (Signed)
I spoke with the patient. She is aware she may increase her lasix to 40 mg daily per Dr. Eldridge DaceVaranasi. She would like a refill sent in to the pharmacy for lasix 20 mg tablets so she can take 20 mg daily on the days her weight is controlled and she is not having any symptoms. She is already due for labs for Xarelto on 3/14.

## 2015-01-02 ENCOUNTER — Other Ambulatory Visit (INDEPENDENT_AMBULATORY_CARE_PROVIDER_SITE_OTHER): Payer: Medicare Other | Admitting: *Deleted

## 2015-01-02 DIAGNOSIS — Z7901 Long term (current) use of anticoagulants: Secondary | ICD-10-CM

## 2015-01-02 LAB — CBC
HEMATOCRIT: 43 % (ref 36.0–46.0)
Hemoglobin: 14.2 g/dL (ref 12.0–15.0)
MCHC: 32.9 g/dL (ref 30.0–36.0)
MCV: 92.7 fl (ref 78.0–100.0)
PLATELETS: 175 10*3/uL (ref 150.0–400.0)
RBC: 4.64 Mil/uL (ref 3.87–5.11)
RDW: 14.1 % (ref 11.5–15.5)
WBC: 13.3 10*3/uL — AB (ref 4.0–10.5)

## 2015-01-02 LAB — BASIC METABOLIC PANEL
BUN: 26 mg/dL — AB (ref 6–23)
CHLORIDE: 96 meq/L (ref 96–112)
CO2: 37 mEq/L — ABNORMAL HIGH (ref 19–32)
Calcium: 10 mg/dL (ref 8.4–10.5)
Creatinine, Ser: 0.82 mg/dL (ref 0.40–1.20)
GFR: 71.56 mL/min (ref >60.00)
GLUCOSE: 52 mg/dL — AB (ref 70–99)
POTASSIUM: 3.4 meq/L — AB (ref 3.5–5.1)
SODIUM: 137 meq/L (ref 135–145)

## 2015-01-02 NOTE — Addendum Note (Signed)
Addended by: Tonita PhoenixBOWDEN, Amery Minasyan K on: 01/02/2015 09:12 AM   Modules accepted: Orders

## 2015-03-27 ENCOUNTER — Other Ambulatory Visit: Payer: Self-pay | Admitting: Interventional Cardiology

## 2015-03-28 NOTE — Telephone Encounter (Signed)
Per note [phone]

## 2015-03-30 ENCOUNTER — Encounter: Payer: Self-pay | Admitting: Interventional Cardiology

## 2015-03-30 ENCOUNTER — Ambulatory Visit (INDEPENDENT_AMBULATORY_CARE_PROVIDER_SITE_OTHER): Payer: Medicare Other | Admitting: Interventional Cardiology

## 2015-03-30 VITALS — BP 110/60 | HR 83 | Ht 67.5 in | Wt 156.0 lb

## 2015-03-30 DIAGNOSIS — I482 Chronic atrial fibrillation, unspecified: Secondary | ICD-10-CM

## 2015-03-30 DIAGNOSIS — Z7901 Long term (current) use of anticoagulants: Secondary | ICD-10-CM

## 2015-03-30 DIAGNOSIS — I252 Old myocardial infarction: Secondary | ICD-10-CM | POA: Diagnosis not present

## 2015-03-30 DIAGNOSIS — I5032 Chronic diastolic (congestive) heart failure: Secondary | ICD-10-CM

## 2015-03-30 DIAGNOSIS — R609 Edema, unspecified: Secondary | ICD-10-CM | POA: Diagnosis not present

## 2015-03-30 HISTORY — DX: Long term (current) use of anticoagulants: Z79.01

## 2015-03-30 NOTE — Patient Instructions (Signed)
Medication Instructions:  None  Labwork: None  Testing/Procedures: None  Follow-Up: Your physician wants you to follow-up in: 1 year with Dr. Varanasi.  You will receive a reminder letter in the mail two months in advance. If you don't receive a letter, please call our office to schedule the follow-up appointment.    Any Other Special Instructions Will Be Listed Below (If Applicable).   

## 2015-03-30 NOTE — Progress Notes (Signed)
Patient ID: [name] Diaz, female   DOB: Jun 11, 1934, 81 y.o.   MRN: [id number]     Cardiology Office Note   Date:  03/30/2015   ID:  [name] Diaz, DOB 04-21-1934, MRN [id number]  PCP:  Charolett Bumpers, MD    No chief complaint on file. f/u CAD   Wt Readings from Last 3 Encounters:  03/30/15 156 lb (70.761 kg)  03/29/14 161 lb 3.2 oz (73.12 kg)  08/12/12 158 lb (71.668 kg)       History of Present Illness: [name] Diaz is a 81 y.o. female  who has had AFib and MI. She has had a stroke secondary to atrial fibrillation in the past. She had an LAD stent 2011.  She has several rashes which may be related to Provera and Xarelto. THis has improved. She follows with dermatology. Currently she feels well. No chest pain. Minimal shortness of breath but this does not limit her. No palpitations. No significant leg swelling on Lasix.   She had coils placed in veins in her vulva due to bleeding in Jan 2016.  THis has improved but her legs feels worse.  She still has occasional swelling and spider veins.  Worse by midday.    She is not going to go back to [facility] where the initial procedure was done.    She can get a rare ache in the chest.  UP to 2x/year.  It is mild and goes away on its own.  Lasts 5 minutes.  Not like the heart attack pain she had in the past.  She does not even think of using NTG when this happen.  It was not related to exertion.  Execise has been limited due to the GYN bleeding.  She can restart exercise at this time. She walks twice a day.    Past Medical History  Diagnosis Date  . Myocardial infarction 2011    DES TO LAD  . Dysrhythmia     Atrial fib  . Shortness of breath     on exertion  . A-fib   . History of colon polyps   . Colonic diverticular disease   . History of cataract surgery     with lens implant     Past Surgical History  Procedure Laterality Date  . Vein ligation  1960s  . Hysteroscopy w/d&c  08/25/2012    Procedure: DILATATION AND CURETTAGE  /HYSTEROSCOPY;  Surgeon: Geryl Rankins, MD;  Location: [facility];  Service: Gynecology;  Laterality: N/A;  TruClear     Current Outpatient Prescriptions  Medication Sig Dispense Refill  . Calcium Carbonate-Vitamin D (CALTRATE 600+D) 600-400 MG-UNIT per tablet Take 1 tablet by mouth daily.     . diazepam (VALIUM) 5 MG tablet Take 5 mg by mouth at bedtime as needed. For anxiety    . fish oil-omega-3 fatty acids 1000 MG capsule Take 3 g by mouth daily.    . furosemide (LASIX) 20 MG tablet Take two tablets (40 mg ) by mouth once daily as directed. 60 tablet 6  . hydrocortisone cream 1 % Apply 1 application topically 2 (two) times daily as needed. For insect bites    . metoprolol (LOPRESSOR) 100 MG tablet TAKE 1 TABLET (100 MG TOTAL) BY MOUTH 2 (TWO) TIMES DAILY. 60 tablet 6  . Multiple Vitamin (MULTIVITAMIN WITH MINERALS) TABS Take 1 tablet by mouth daily.    . nitroGLYCERIN (NITROSTAT) 0.4 MG SL tablet Place 1 tablet (0.4 mg total) under the tongue every  5 (five) minutes as needed for chest pain. 25 tablet 5  . simvastatin (ZOCOR) 20 MG tablet Take 1 tablet (20 mg total) by mouth at bedtime. 30 tablet 5  . XARELTO 20 MG TABS tablet TAKE 1 TABLET EVERY DAY WITH SUPPER 30 tablet 3   No current facility-administered medications for this visit.    Allergies:   Clindamycin/lincomycin    Social History:  The patient  reports that she has quit smoking. She does not have any smokeless tobacco history on file. She reports that she does not drink alcohol or use illicit drugs.   Family History:  The patient's *family history includes CVA in her mother.    ROS:  Please see the history of present illness.   Otherwise, review of systems are positive for DOE, stable.   All other systems are reviewed and negative.    PHYSICAL EXAM: VS:  BP 110/60 mmHg  Pulse 83  Ht 5' 7.5" (1.715 m)  Wt 156 lb (70.761 kg)  BMI 24.06 kg/m2 , BMI Body mass index is 24.06 kg/(m^2). GEN: Well nourished, well developed,  in no acute distress HEENT: normal Neck: no JVD, carotid bruits, or masses Cardiac: irregularly irregular; no murmurs, rubs, or gallops,tr edema  Respiratory:  clear to auscultation bilaterally, normal work of breathing GI: soft, nontender, nondistended, + BS MS: no deformity or atrophy Skin: warm and dry, no rash, spider veins, prominent varicose veins bilaterally Neuro:  Strength and sensation are intact Psych: euthymic mood, full affect   EKG:   The ekg ordered today demonstrates AFib, controlled rate, no ST segment changes   Recent Labs: 01/02/2015: BUN 26*; Creatinine, Ser 0.82; Hemoglobin 14.2; Platelets 175.0; Potassium 3.4*; Sodium 137   Lipid Panel    Component Value Date/Time   CHOL  09/05/2010 0420    131        ATP III CLASSIFICATION:  <200     mg/dL   Desirable  409-811  mg/dL   Borderline High  >=914    mg/dL   High          TRIG 67 09/05/2010 0420   HDL 58 09/05/2010 0420   CHOLHDL 2.3 09/05/2010 0420   VLDL 13 09/05/2010 0420   LDLCALC  09/05/2010 0420    60        Total Cholesterol/HDL:CHD Risk Coronary Heart Disease Risk Table                     Men   Women  1/2 Average Risk   3.4   3.3  Average Risk       5.0   4.4  2 X Average Risk   9.6   7.1  3 X Average Risk  23.4   11.0        Use the calculated Patient Ratio above and the CHD Risk Table to determine the patient's CHD Risk.        ATP III CLASSIFICATION (LDL):  <100     mg/dL   Optimal  782-956  mg/dL   Near or Above                    Optimal  130-159  mg/dL   Borderline  213-086  mg/dL   High  >578     mg/dL   Very High     Other studies Reviewed: Additional studies/ records that were reviewed today with results demonstrating: cath report reviewed .   ASSESSMENT AND PLAN:  Atrial fibrillation  Continue Metoprolol Tartrate Tablet, 100 MG, TAKE 1 TABLET BY MOUTH TWICE A DAY Notes: Rate controlled. Xarelto for stroke prevention. Discussed again eliquis due to nosebleeds. Still  does not want to switch at this point. Rare nosebleeds. WIll continue Xarelto for now. The TV commercials about Xarelto scare her as well.    2. Old myocardial infarction  Continue Simvastatin Tablet, 20 MG, TAKE 1 TABLET AT BEDTIME Continue Nitroglycerin 0.4 mg tablet, 0.4 mg, 1 tablet as directed, SL, as directed prn chest pain. Take every 5 minutes, up to 3 doses, if pain does not go away call 911 or go to ER. Notes: No CHF. BMS in the LAD, vision 2.75 x 18, postdilated to 3.3 mm.    3. Chronic diastolic heart failure  Increase Lasix Tablet, 20 MG, 2 tablet, Orally, Once a day Notes: Likely caused by age and AFib. Now taking Lasix daily. She notices a difference if she skips a day. LE edema: likely related to venous insufficiency. LE edema better with the higher dose  Of Lasix. She adjusts the dose of the Lasix as needed as well.         4. Venous insufficiency: COntinue to elevate her legs.  SHe will look into more compression stockings that are more comfortable.  She does not want to f/u with the interventional radiologist at [facility].  If she has worsening swelling, we can refer to Dr. Arbie Cookey.   Current medicines are reviewed at length with the patient today.  The patient concerns regarding her medicines were addressed.  The following changes have been made:  No change  Labs/ tests ordered today include:   Orders Placed This Encounter  Procedures  . EKG 12-Lead    Recommend 150 minutes/week of aerobic exercise Low fat, low carb, high fiber diet recommended  Disposition:   FU in 1 year   Delorise Jackson., MD  03/30/2015 4:24 PM    [facility] [address] [place], [place], [place]  [address] Phone: [phone]; Fax: [phone]

## 2015-04-10 ENCOUNTER — Encounter: Payer: Self-pay | Admitting: Interventional Cardiology

## 2015-05-02 ENCOUNTER — Other Ambulatory Visit: Payer: Self-pay

## 2015-05-02 DIAGNOSIS — Z1231 Encounter for screening mammogram for malignant neoplasm of breast: Secondary | ICD-10-CM

## 2015-05-25 ENCOUNTER — Ambulatory Visit
Admission: RE | Admit: 2015-05-25 | Discharge: 2015-05-25 | Disposition: A | Discharge status: Home / Self Care | Payer: Medicare Other | Source: Ambulatory Visit

## 2015-05-25 DIAGNOSIS — Z1231 Encounter for screening mammogram for malignant neoplasm of breast: Secondary | ICD-10-CM

## 2015-05-29 ENCOUNTER — Other Ambulatory Visit: Payer: Self-pay | Admitting: Interventional Cardiology

## 2015-05-29 ENCOUNTER — Other Ambulatory Visit: Payer: Self-pay | Admitting: Nurse Practitioner

## 2015-05-30 ENCOUNTER — Other Ambulatory Visit: Payer: Self-pay | Admitting: Gastroenterology

## 2015-05-30 DIAGNOSIS — I8390 Asymptomatic varicose veins of unspecified lower extremity: Secondary | ICD-10-CM

## 2015-06-20 ENCOUNTER — Encounter (INDEPENDENT_AMBULATORY_CARE_PROVIDER_SITE_OTHER): Payer: Self-pay

## 2015-06-20 ENCOUNTER — Ambulatory Visit
Admission: RE | Admit: 2015-06-20 | Discharge: 2015-06-20 | Disposition: A | Discharge status: Home / Self Care | Payer: Medicare Other | Source: Ambulatory Visit | Attending: Gastroenterology | Admitting: Gastroenterology

## 2015-06-20 DIAGNOSIS — I8390 Asymptomatic varicose veins of unspecified lower extremity: Secondary | ICD-10-CM

## 2015-06-20 NOTE — Consult Note (Signed)
Chief Complaint: Patient was seen in consultation today for  Chief Complaint  Patient presents with  . Advice Only    Bilateral Varicose Veins     at the request of Johnson,Martin K  Referring Physician(s): Johnson,Martin K  History of Present Illness: [name] Diaz is a 81 y.o. female with a long history of lower extremity varicose veins. She had bilateral GSD vein stripping back in 1966 due to severe bilateral pain, which relieved the symptoms at that time. She then developed bleeding vulvar veins, and was seen by IR in [place], and underwent left ovarian vein embolization for pelvic congestion syndrome and reflux in January 2016. She has had no further problems with vulvar varicosities. SHe states that her lower extremity pain reoccurred shortly after this procedure. She complains of bilateral lower extremity edema, pain, tiredness, skin color changes, spider and varicose veins. She's tried knee-high compression hose but these bind her about the calf, and she does not use them now. No history of venous ulceration, DVT, or PE. There is a history of varicose/spider veins in her mother. She is G2 P2. 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 She does not use any pain medications for the varicose/spider veins. She does feel that elevation helps significantly.  Past Medical History  Diagnosis Date  . Myocardial infarction 2011    DES TO LAD  . Dysrhythmia     Atrial fib  . Shortness of breath     on exertion  . A-fib   . History of colon polyps   . Colonic diverticular disease   . History of cataract surgery     with lens implant     Past Surgical History  Procedure Laterality Date  . Vein ligation  1960s  . Hysteroscopy w/d&c  08/25/2012    Procedure: DILATATION AND CURETTAGE /HYSTEROSCOPY;  Surgeon: Geryl Rankins, MD;  Location: [facility];  Service: Gynecology;  Laterality: N/A;  TruClear  . Eye surgery     Bilateral Cataract surgery w/ lens implant      Allergies: Clindamycin/lincomycin  Medications: Prior to Admission medications   Medication Sig Start Date End Date Taking? Authorizing Provider  Calcium Carbonate-Vitamin D (CALTRATE 600+D) 600-400 MG-UNIT per tablet Take 1 tablet by mouth daily.    Yes Historical Provider, MD  cycloSPORINE (RESTASIS) 0.05 % ophthalmic emulsion Place 1 drop into both eyes 2 (two) times daily.   Yes Historical Provider, MD  diazepam (VALIUM) 5 MG tablet Take 5 mg by mouth at bedtime as needed. For anxiety   Yes Historical Provider, MD  fish oil-omega-3 fatty acids 1000 MG capsule Take 3 g by mouth daily.   Yes Historical Provider, MD  furosemide (LASIX) 20 MG tablet Take two tablets (40 mg ) by mouth once daily as directed. 12/19/14  Yes Corky Crafts, MD  hydrocortisone cream 1 % Apply 1 application topically 2 (two) times daily as needed. For insect bites   Yes Historical Provider, MD  metoprolol (LOPRESSOR) 100 MG tablet TAKE 1 TABLET (100 MG TOTAL) BY MOUTH 2 (TWO) TIMES DAILY. 05/29/15  Yes Corky Crafts, MD  Multiple Vitamin (MULTIVITAMIN WITH MINERALS) TABS Take 1 tablet by mouth daily.   Yes Historical Provider, MD  nitroGLYCERIN (NITROSTAT) 0.4 MG SL tablet Place 1 tablet (0.4 mg total) under the tongue every 5 (five) minutes as needed for chest pain. 03/29/14  Yes Corky Crafts, MD  simvastatin (  ZOCOR) 20 MG tablet TAKE 1 TABLET (20 MG TOTAL) BY MOUTH AT BEDTIME. 05/29/15  Yes Rosalio Macadamia, NP  XARELTO 20 MG TABS tablet TAKE 1 TABLET EVERY DAY WITH SUPPER 03/28/15  Yes Corky Crafts, MD     Family History  Problem Relation Age of Onset  . CVA Mother     Social History   Social History  . Marital Status: Widowed    Spouse Name: N/A  . Number of Children: N/A  . Years of Education: N/A   Social History Main Topics  . Smoking status: Former Smoker -- 40 years  . Smokeless tobacco: Not on file  . Alcohol Use: No  . Drug  Use: No  . Sexual Activity: Not on file   Other Topics Concern  . Not on file   Social History Narrative    ECOG Status: 1 - Symptomatic but completely ambulatory  Review of Systems: A 12 point ROS discussed and pertinent positives are indicated in the HPI above.  All other systems are negative.  Review of Systems  Vital Signs: BP 137/81 mmHg  Pulse 87  Temp(Src) 97.7 F (36.5 C) (Oral)  Resp 15  Ht 5' 7.5" (1.715 m)  Wt 159 lb (72.122 kg)  BMI 24.52 kg/m2  SpO2 99%  Physical Exam  Constitutional: She appears well-developed and well-nourished. No distress.  HENT:  Head: Normocephalic and atraumatic.  Eyes: EOM are normal. Right eye exhibits no discharge. Left eye exhibits no discharge. No scleral icterus.  Neck: Normal range of motion.  Pulmonary/Chest: Effort normal. No stridor. No respiratory distress.  Abdominal: She exhibits no distension.  Musculoskeletal: Normal range of motion. She exhibits no tenderness.       Legs: Large bilateral leg varicose veins, blue and spider veins at foot/ankle.  Skin: Skin is warm and dry. No rash noted. She is not diaphoretic. No erythema.  Psychiatric: She has a normal mood and affect. Her behavior is normal. Judgment and thought content normal.    Mallampati Score:     Imaging: US Venous Img Lower Bilateral  06/20/2015   CLINICAL DATA:  Bilateral lower extremity varicose veins. Remote history of bilateral lower extremity vein stripping/ligation. Recent left ovarian vein embolization due to vulvar varicosities. Progressive bilateral lower extremity pain.  EXAM: BILATERAL LOWER EXTREMITY VENOUS DUPLEX ULTRASOUND  TECHNIQUE: Gray-scale sonography with graded compression, as well as color Doppler and duplex ultrasound, were performed to evaluate the deep and superficial veins of both lower extremities. Spectral Doppler was utilized to evaluate flow at rest and with distal augmentation maneuvers. A complete superficial venous  insufficiency exam was performed in the upright standing position. I personally performed the technical portion of the exam.  COMPARISON:  None.  FINDINGS: Deep Venous System:  Evaluation of the deep venous system including the common femoral, femoral, profunda femoral, popliteal and calf veins (where visible) demonstrate no evidence of deep venous thrombosis. The vessels are compressible and demonstrate normal respiratory phasicity and response to augmentation. No evidence of the deep venous reflux.  Superficial Venous System:  RIGHT  SFJ: Proximal ligation.  GSV Prox Thigh: Surgically absent  GSV Mid Thigh: Surgically absent  GSV Lower Thigh: Surgically absent  GSV at Knee: Surgically absent  GSV Prox Calf: Surgically absent  GSV Mid Calf: Surgically absent  GSV Distal Calf: Surgically absent  There is a large refluxing varicose vein extending from the right inguinal region through the anterior thigh and along the medial knee into the distal calf. This  communicates with the bulk of the large visible varicose veins of right lower extremity.  SPJ: Patent, nondilated  SSV Prox: Dilated without reflux  SSV Mid: Dilated with 3 seconds reflux  SSV Distal: Dilated, no significant reflux  LEFT  SFJ: Patent, dilated to a diameter of 9 mm, with the greater than 5 seconds reflux post augmentation.  GSV Prox Thigh: Surgically absent  GSV Mid Thigh: Surgically absent  GSV Lower Thigh: Surgically absent  GSV at Knee: Surgically absent  GSV Prox Calf: Surgically absent  GSV Mid Calf: Surgically absent  GSV Distal Calf: Surgically absent  Large varicose veins along the posterolateral distal thigh refluxing from a proximal branch it of the saphenofemoral junction.  SPJ: Not visualized  SSV Prox: Diminutive.  SSV Mid: mildly dilated, no reflux  SSV Distal: Dilated, no reflux  IMPRESSION: 1. Normal deep venous system of both lower extremities. 2. Surgical consistent with bilateral greater saphenous vein stripping/ ligation. 3.  Extensive large refluxing varicose veins in the right thigh, knee and calf, related to incompletely visualized pelvic supply. 4. Extensive large refluxing varicose veins in the left thigh and calf from an early proximal branch near the saphenofemoral junction.   Electronically Signed   By: Corlis Leak M.D.   On: 06/20/2015 16:27   Mm Digital Screening Bilateral  05/25/2015   CLINICAL DATA:  Screening.  EXAM: DIGITAL SCREENING BILATERAL MAMMOGRAM WITH CAD  COMPARISON:  Previous exam(s).  ACR Breast Density Category b: There are scattered areas of fibroglandular density.  FINDINGS: There are no findings suspicious for malignancy. Images were processed with CAD.  IMPRESSION: No mammographic evidence of malignancy. A result letter of this screening mammogram will be mailed directly to the patient.  RECOMMENDATION: Screening mammogram in one year. (Code:SM-B-01Y)  BI-RADS CATEGORY  1: Negative.   Electronically Signed   By: Dalphine Handing M.D.   On: 05/25/2015 17:21    Labs:  CBC:  Recent Labs  07/04/14 1005 01/02/15 0912  WBC 11.8* 13.3*  HGB 13.5 14.2  HCT 41.0 43.0  PLT 153.0 175.0    COAGS: No results for input(s): INR, APTT in the last 8760 hours.  BMP:  Recent Labs  07/04/14 1005 01/02/15 0912  NA 140 137  K 3.9 3.4*  CL 100 96  CO2 30 37*  GLUCOSE 92 52*  BUN 19 26*  CALCIUM 9.4 10.0  CREATININE 0.8 0.82    LIVER FUNCTION TESTS: No results for input(s): BILITOT, AST, ALT, ALKPHOS, PROT, ALBUMIN in the last 8760 hours.  TUMOR MARKERS: No results for input(s): AFPTM, CEA, CA199, CHROMGRNA in the last 8760 hours.  Assessment and Plan:  My impression is that this patient has symptomatic bilateral recurrent varicose veins post bilateral saphenous vein stripping. There is no underlying residual saphenous vein segment to approach for endovascular ablation. The large bilateral varicose veins would probably require combination of ambulatory phlebectomy and sclerotherapy for adequate  symptomatic resolution. I'll need to have her off her anticoagulation for optimized outcome with regards to the veins. We discussed the pathophysiology of varicose veins, natural history, treatment options including conservative treatment with compression hose, sclerotherapy, and ambulatory phlebectomy. We discussed time course of symptom resolution, possible risks and complications. I'm not sure why the ovarian vein embolization seems temporally related to exacerbation of her lower extremity symptoms, but nonetheless I think that a combination of ambulatory phlebectomy and sclerotherapy is indicated due to her current symptomatology, as well as her significant risk of progression of skin changes including  ulceration due to the fairly advanced nature of her current varicose veins. I gave her some literature to review. She seemed well-informed, and asked appropriate questions, which I answered. She knows to call with any additional questions. We need to check with her cardiologist Dr.Varanasi regarding appropriateness of holding anticoagulation around the time of treatment bilaterally. We can stagger her bilateral treatments by 1 month.  Thank you for this interesting consult.  I greatly enjoyed meeting MCKENIZE MEZERA and look forward to participating in their care.  A copy of this report was sent to the requesting provider on this date.  Signed: Sela Falk III, DAYNE Jeff Mccallum 06/20/2015, 4:43 PM   I spent a total of  60 Minutes   in face to face in clinical consultation, greater than 50% of which was counseling/coordinating care for symptomatic bilateral lower extremity varicose veins.

## 2015-06-22 ENCOUNTER — Telehealth: Payer: Self-pay | Admitting: Interventional Cardiology

## 2015-06-22 NOTE — Telephone Encounter (Signed)
Spoke with pt and informed her that Dr. Eldridge Dace advised against her being off of Xarelto for more than a few days at a time due to her stroke risk off of anticoags with Afib. Pt verbalized understanding and said that Dr. Everlene Farrier did plan to speak with Dr. Eldridge Dace and that she would await and see what conclusion they come to once they have spoken.

## 2015-06-22 NOTE — Telephone Encounter (Signed)
I would advise against being off of Xarelto for more than a few days at a time.  Her stroke risk off of anticoagulation is significant in the setting of AFib.

## 2015-06-22 NOTE — Telephone Encounter (Signed)
New message     Pt has questions regarding coming off of blood thinner for upcoming surgery Pt states surgeon has already requested surgical clearance

## 2015-06-22 NOTE — Telephone Encounter (Signed)
Spoke with pt and she was seen yesterday by Dr. Deanne Coffer at [facility]. Pt states that Dr. Deanne Coffer will be in contact with Dr. Eldridge Dace in regards to pt possibly getting sclerotherapy and a phlebectomy in bilateral legs. Pt states that she wanted to call to make Dr. Eldridge Dace and wanted to get his opinion on something Dr. Deanne Coffer said in regards to anitcoags interfering with procedure. Pt states Dr. Deanne Coffer would like for pt to be off of her Xarelto for 2-2.5 months.  Pt states that he plans to do one leg and then a month later doing the other leg. Pt wanted to let Dr. Eldridge Dace know that she is more than willing to spread this out and wait 2-3 months between each leg so that this can be done safely. Pt concerned about being off of her Xarelto for 2-2.5 months straight. Advised pt that I would route this information to Dr. Eldridge Dace.

## 2015-07-20 ENCOUNTER — Other Ambulatory Visit: Payer: Self-pay | Admitting: Interventional Cardiology

## 2015-07-31 ENCOUNTER — Other Ambulatory Visit (HOSPITAL_COMMUNITY): Payer: Self-pay | Admitting: Interventional Radiology

## 2015-07-31 DIAGNOSIS — I8393 Asymptomatic varicose veins of bilateral lower extremities: Secondary | ICD-10-CM

## 2015-08-01 ENCOUNTER — Telehealth: Payer: Self-pay | Admitting: Interventional Cardiology

## 2015-08-01 ENCOUNTER — Other Ambulatory Visit (HOSPITAL_COMMUNITY): Payer: Self-pay | Admitting: Interventional Radiology

## 2015-08-01 DIAGNOSIS — I8390 Asymptomatic varicose veins of unspecified lower extremity: Secondary | ICD-10-CM

## 2015-08-01 NOTE — Telephone Encounter (Signed)
OK to hold Xarelto 2 days prior to procedure.

## 2015-08-01 NOTE — Telephone Encounter (Signed)
Faxed clearance over to [facility].

## 2015-08-01 NOTE — Telephone Encounter (Signed)
Will forward to Dr. Varanasi for review and advisement.  

## 2015-08-01 NOTE — Telephone Encounter (Signed)
Spoke with Chip Boer at [facility] and she states that pt would be allowed to start Xarelto back the day after her procedure.

## 2015-08-01 NOTE — Telephone Encounter (Signed)
New message  .Request for surgical clearance:  1. What type of surgery is being performed? Phlebectomy Procedure   2. When is this surgery scheduled? 11/09    3. Are there any medications that need to be held prior to surgery and how long? Hold Xarelto 2 days prior   4. Name of physician performing surgery? Dr. Deanne Coffer  What is your office phone and fax number?  Fax # [phone] Attn to Chip Boer

## 2015-08-01 NOTE — Telephone Encounter (Signed)
Is she allowed to restart the Xarelto the day after the procedure?

## 2015-08-03 ENCOUNTER — Other Ambulatory Visit: Payer: Medicare Other

## 2015-08-17 ENCOUNTER — Ambulatory Visit
Admission: RE | Admit: 2015-08-17 | Discharge: 2015-08-17 | Disposition: A | Discharge status: Home / Self Care | Payer: Medicare Other | Source: Ambulatory Visit | Attending: Interventional Radiology | Admitting: Interventional Radiology

## 2015-08-17 DIAGNOSIS — I8390 Asymptomatic varicose veins of unspecified lower extremity: Secondary | ICD-10-CM

## 2015-08-17 NOTE — Consult Note (Signed)
Chief Complaint: Patient was seen in consultation today for  Chief Complaint  Patient presents with  . Follow-up    Patient has additional questions about phlebectomy   at the request of Danise Edge  Referring Physician(s): Danise Edge  History of Present Illness: [name] Diaz is a 81 y.o. female known to our service from her consultation on 06/20/2015 regarding bilateral symptomatic lower extremity varicose veins. She came in today with additional questions before scheduling treatment. She was accompanied by a friend, who took notes. We reviewed the ultrasound findings in both lower extremities, indications for treatment, and treatment options. We reviewed in detail the ambulatory phlebectomy procedure which would start in the proximal thigh and extending distally as far is reasonable at possible to disrupt the refluxing supply to her symptomatic varicose veins. We discussed the potential need for follow-up sclerotherapy of any significant residual varicose veins. We discussed that the smaller blue and spider veins would not be directly treated. We discussed possible risks and complications predominantly the risk of bleeding, infection, and potential for cutaneous nerve or skin damage. We discussed the timing of going on and off Xarelto as coordinated with her cardiologist. We discussed staggering treatments by about 1 month. We discussed the need for thigh-high 20-30 mmHg compression hose around the clock immediate post procedure, and then follow up and about most critically during the first month and then is recommended probably lifetime. She noted to have several error in the medical record from her time at [facility]. She stated that recurrent vulvar swelling was noted on a recent gynecologic examination, but remains asymptomatic.  Past Medical History  Diagnosis Date  . Myocardial infarction 2011    DES TO LAD  . Dysrhythmia     Atrial fib  . Shortness of breath     on  exertion  . A-fib   . History of colon polyps   . Colonic diverticular disease   . History of cataract surgery     with lens implant     Past Surgical History  Procedure Laterality Date  . Vein ligation  1960s  . Hysteroscopy w/d&c  08/25/2012    Procedure: DILATATION AND CURETTAGE /HYSTEROSCOPY;  Surgeon: Geryl Rankins, MD;  Location: [facility];  Service: Gynecology;  Laterality: N/A;  TruClear  . Eye surgery      Bilateral Cataract surgery w/ lens implant      Allergies: Clindamycin/lincomycin  Medications: Prior to Admission medications   Medication Sig Start Date End Date Taking? Authorizing Provider  Calcium Carbonate-Vitamin D (CALTRATE 600+D) 600-400 MG-UNIT per tablet Take 1 tablet by mouth daily.    Yes Historical Provider, MD  cycloSPORINE (RESTASIS) 0.05 % ophthalmic emulsion Place 1 drop into both eyes 2 (two) times daily.   Yes Historical Provider, MD  diazepam (VALIUM) 5 MG tablet Take 5 mg by mouth at bedtime as needed. For anxiety   Yes Historical Provider, MD  fish oil-omega-3 fatty acids 1000 MG capsule Take 3 g by mouth daily.   Yes Historical Provider, MD  furosemide (LASIX) 20 MG tablet TAKE TWO TABLETS (40 MG ) BY MOUTH ONCE DAILY AS DIRECTED. 07/21/15  Yes Corky Crafts, MD  hydrocortisone cream 1 % Apply 1 application topically 2 (two) times daily as needed. For insect bites   Yes Historical Provider, MD  metoprolol (LOPRESSOR) 100 MG tablet TAKE 1 TABLET (100 MG TOTAL) BY MOUTH 2 (TWO) TIMES DAILY. 05/29/15  Yes Corky Crafts, MD  Multiple Vitamin (MULTIVITAMIN WITH MINERALS) TABS Take 1 tablet by mouth daily.   Yes Historical Provider, MD  nitroGLYCERIN (NITROSTAT) 0.4 MG SL tablet Place 1 tablet (0.4 mg total) under the tongue every 5 (five) minutes as needed for chest pain. 03/29/14  Yes Corky CraftsJayadeep S Varanasi, MD  rivaroxaban (XARELTO) 20 MG TABS tablet Take 1 tablet (20 mg total) by mouth daily with supper. 07/21/15  Yes Corky CraftsJayadeep S Varanasi, MD    simvastatin (ZOCOR) 20 MG tablet TAKE 1 TABLET (20 MG TOTAL) BY MOUTH AT BEDTIME. 05/29/15  Yes Rosalio MacadamiaLori C Gerhardt, NP     Family History  Problem Relation Age of Onset  . CVA Mother     Social History   Social History  . Marital Status: Widowed    Spouse Name: N/A  . Number of Children: N/A  . Years of Education: N/A   Social History Main Topics  . Smoking status: Former Smoker -- 40 years  . Smokeless tobacco: Not on file  . Alcohol Use: No  . Drug Use: No  . Sexual Activity: Not on file   Other Topics Concern  . Not on file   Social History Narrative    ECOG Status: 1 - Symptomatic but completely ambulatory  Review of Systems: A 12 point ROS discussed and pertinent positives are indicated in the HPI above.  All other systems are negative.  Review of Systems  Vital Signs: BP 106/66 mmHg  Pulse 78  Temp(Src) 97.8 F (36.6 C) (Oral)  Resp 14  SpO2 98%  Physical Exam  Mallampati Score:     Imaging: No results found.  Labs:  CBC:  Recent Labs  01/02/15 0912  WBC 13.3*  HGB 14.2  HCT 43.0  PLT 175.0    COAGS: No results for input(s): INR, APTT in the last 8760 hours.  BMP:  Recent Labs  01/02/15 0912  NA 137  K 3.4*  CL 96  CO2 37*  GLUCOSE 52*  BUN 26*  CALCIUM 10.0  CREATININE 0.82    LIVER FUNCTION TESTS: No results for input(s): BILITOT, AST, ALT, ALKPHOS, PROT, ALBUMIN in the last 8760 hours.  TUMOR MARKERS: No results for input(s): AFPTM, CEA, CA199, CHROMGRNA in the last 8760 hours.  Assessment and Plan:  My impression is that she remains an appropriate candidate for treatment of her symptomatic bilateral lower extremity varicose veins due to previously documented symptoms including pain, with a secondary benefit of decreasing risk of future medical complications of varicose veins including lower extremity ulceration. She is not a candidate for endovascular ablation. She would be best served by ambulatory phlebectomy  laterally, with follow-up selective sclerotherapy as needed. I feel that she is well informed about the procedure, risks, anticipated benefits, and alternatives. We can set up treatment electively at her convenience, with a hiatus in her anticoagulation as coordinated by her cardiologist.    A copy of this report was sent to the requesting provider on this date.  Signed: Laural Eiland III, DAYNE Jermall Isaacson 08/17/2015, 9:12 AM   I spent a total of    25 Minutes in face to face in clinical consultation, greater than 50% of which was counseling/coordinating care for bilateral lower extremity symptomatic varicose veins.

## 2015-08-21 ENCOUNTER — Other Ambulatory Visit (HOSPITAL_COMMUNITY): Payer: Self-pay | Admitting: Interventional Radiology

## 2015-08-21 DIAGNOSIS — I8393 Asymptomatic varicose veins of bilateral lower extremities: Secondary | ICD-10-CM

## 2015-09-20 ENCOUNTER — Ambulatory Visit
Admission: RE | Admit: 2015-09-20 | Discharge: 2015-09-20 | Disposition: A | Discharge status: Home / Self Care | Payer: Medicare Other | Source: Ambulatory Visit | Attending: Interventional Radiology | Admitting: Interventional Radiology

## 2015-09-20 DIAGNOSIS — I8393 Asymptomatic varicose veins of bilateral lower extremities: Secondary | ICD-10-CM

## 2015-09-20 MED ORDER — DIAZEPAM 10 MG PO TABS: 10.0000 mg | ORAL_TABLET | Freq: Once | ORAL | Status: DC

## 2015-09-20 NOTE — Progress Notes (Signed)
1235  Informed consent obtained.    1245  Valium 10 mg po.  1250 22 gauge x 1" insyte catheter started IV Left antecubital (on 1st attempt) with Saline lock & 7" extension.  Flushed w/ 10 mL NSS without difficulty.  Site unremarkable.  1330 Procedure started.  1500 Procedure completed.  Tolerated well.  Wearing thigh high graduated compression garment (20-30 mm Hg) on RLE.  1510  IV discontinued Left Antecubital, catheter intact.  Site unremarkable.  1522  Ambulated x 10 mins without assistance.  Gait steady.  Tolerated well.  [id number]  Peggy (friend of patient) here to provide transportation to home.  Patient discharged to home.  Follow up appointment on 10/05/15 at 8:30 am.  Amado NashHenri Ottavio Norem, RN 09/20/2015 3:39 PM

## 2015-09-26 ENCOUNTER — Other Ambulatory Visit (HOSPITAL_COMMUNITY): Payer: Self-pay | Admitting: Interventional Radiology

## 2015-09-26 DIAGNOSIS — I8393 Asymptomatic varicose veins of bilateral lower extremities: Secondary | ICD-10-CM

## 2015-09-27 ENCOUNTER — Other Ambulatory Visit: Payer: Medicare Other

## 2015-10-05 ENCOUNTER — Other Ambulatory Visit: Payer: Self-pay

## 2015-10-05 ENCOUNTER — Ambulatory Visit
Admission: RE | Admit: 2015-10-05 | Discharge: 2015-10-05 | Disposition: A | Discharge status: Home / Self Care | Payer: Medicare Other | Source: Ambulatory Visit | Attending: Interventional Radiology | Admitting: Interventional Radiology

## 2015-10-05 ENCOUNTER — Other Ambulatory Visit (HOSPITAL_COMMUNITY): Payer: Self-pay | Admitting: Interventional Radiology

## 2015-10-05 DIAGNOSIS — I8393 Asymptomatic varicose veins of bilateral lower extremities: Secondary | ICD-10-CM

## 2015-10-05 NOTE — Consult Note (Signed)
Chief Complaint: Patient was seen in consultation today for scheduled 2 week follow-up post ambulatory phlebectomy at the request of Abdel Effinger  Referring Physician(s): Bowie Delia  History of Present Illness: [name] Diaz is a 81 y.o. female with a long history of symptomatic bilateral lower extremity varicose veins, post bilateral GSD stripping in the 60s, left ovarian vein embolization for pelvic congestion syndrome and symptomatic vulvar veins in January of this year, recurrent bilateral lower extremity varicose veins, post ambulatory phlebectomy of right thigh dominant refluxing superficial varicose veins 09/20/2015. She's done well post procedure. Usual bruising around the treatment site. The palpable tender ribose superficial varicose veins. She feels like the prominent vein on the dorsum of her foot is less distended. Her calf varicose veins are less distended than the contralateral side. She is using her compression hose faithfully during the day. No new symptoms. Past Medical History  Diagnosis Date  . Myocardial infarction [facility]) 2011    DES TO LAD  . Dysrhythmia     Atrial fib  . Shortness of breath     on exertion  . A-fib   . History of colon polyps   . Colonic diverticular disease   . History of cataract surgery     with lens implant     Past Surgical History  Procedure Laterality Date  . Vein ligation  1960s  . Hysteroscopy w/d&c  08/25/2012    Procedure: DILATATION AND CURETTAGE /HYSTEROSCOPY;  Surgeon: Geryl Rankins, MD;  Location: [facility];  Service: Gynecology;  Laterality: N/A;  TruClear  . Eye surgery      Bilateral Cataract surgery w/ lens implant      Allergies: Clindamycin/lincomycin  Medications: Prior to Admission medications   Medication Sig Start Date End Date Taking? Authorizing Provider  calcium carbonate (CALCIUM 600) 600 MG TABS tablet Take 1 tablet by mouth.    Historical Provider, MD  Calcium Carbonate-Vitamin D (CALTRATE 600+D)  600-400 MG-UNIT per tablet Take 1 tablet by mouth daily.     Historical Provider, MD  cycloSPORINE (RESTASIS) 0.05 % ophthalmic emulsion Place 1 drop into both eyes 2 (two) times daily.    Historical Provider, MD  diazepam (VALIUM) 10 MG tablet Take 1 tablet (10 mg total) by mouth once. 09/20/15   Oley Balm, MD  diazepam (VALIUM) 5 MG tablet Take 5 mg by mouth at bedtime as needed. For anxiety    Historical Provider, MD  fish oil-omega-3 fatty acids 1000 MG capsule Take 3 g by mouth daily.    Historical Provider, MD  furosemide (LASIX) 20 MG tablet TAKE TWO TABLETS (40 MG ) BY MOUTH ONCE DAILY AS DIRECTED. 07/21/15   Corky Crafts, MD  hydrocortisone cream 1 % Apply 1 application topically 2 (two) times daily as needed. For insect bites    Historical Provider, MD  metoprolol (LOPRESSOR) 100 MG tablet TAKE 1 TABLET (100 MG TOTAL) BY MOUTH 2 (TWO) TIMES DAILY. 05/29/15   Corky Crafts, MD  Multiple Vitamin (MULTIVITAMIN WITH MINERALS) TABS Take 1 tablet by mouth daily.    Historical Provider, MD  nitroGLYCERIN (NITROSTAT) 0.4 MG SL tablet Place 1 tablet (0.4 mg total) under the tongue every 5 (five) minutes as needed for chest pain. 03/29/14   Corky Crafts, MD  rivaroxaban (XARELTO) 20 MG TABS tablet Take 1 tablet (20 mg total) by mouth daily with supper. 07/21/15   Corky Crafts, MD  simvastatin (ZOCOR) 20 MG tablet TAKE 1 TABLET (20 MG TOTAL) BY  MOUTH AT BEDTIME. 05/29/15   Rosalio MacadamiaLori C Gerhardt, NP     Family History  Problem Relation Age of Onset  . CVA Mother     Social History   Social History  . Marital Status: Widowed    Spouse Name: N/A  . Number of Children: N/A  . Years of Education: N/A   Social History Main Topics  . Smoking status: Former Smoker -- 40 years  . Smokeless tobacco: Not on file  . Alcohol Use: No  . Drug Use: No  . Sexual Activity: Not on file   Other Topics Concern  . Not on file   Social History Narrative    ECOG Status: 1 -  Symptomatic but completely ambulatory  Review of Systems: A 12 point ROS discussed and pertinent positives are indicated in the HPI above.  All other systems are negative.  Review of Systems  Vital Signs: There were no vitals taken for this visit.  Physical Exam  Mallampati Score:     Imaging: Ultrasound today, reported separately, shows normal deep venous system as before. Partial thrombosis of varicose veins in the right thigh with some persistent refluxing segments.  [place] Stab Phlebectomy 20+stabs  09/20/2015  CLINICAL DATA:  Remote greater saphenous vein stripping procedure. Recurrent symptomatic bilateral lower extremity varicose veins with pain, with a secondary benefit of decreasing risk of future medical complications of varicose veins including lower extremity ulceration. She is not a candidate for endovascular ablation. EXAM: ULTRAOUND STAB PHLEBECTOMY 20+STABS TECHNIQUE: The procedure, risks (including but not limited to bleeding, infection, organ damage ), benefits, and alternatives were explained to the patient. Questions regarding the procedure were encouraged and answered. The patient understands and consents to the procedure. A time-out process was followed. No preprocedural antibiotics were indicated. Ultrasound was used to identify and mark the target varicose veins in the right thigh to the upper calf. Region prepped with chlorhexidine, draped in usual sterile fashion. 10 mL lidocaine 1% occluded with 40 mL sterile saline utilized for region of tumescent anesthesia. Ambulatory (stab) phlebectomies of the right thigh and knee were performed using 10 to 20 stabs . The stab phlebectomies were performed with a hook through #11 punctures and they were very satisfactory. Hemostasis achieved with compression. The leg was then cleansed, a sterile dressing applied, and compression hose position. The patient tolerated the procedure well and was sent to recover in satisfactory condition.  COMPARISON:  None. FINDINGS: Tortuous varicose veins in the anterior and anteromedial thigh extending to the medial knee and distally were again identified. Ambulatory (stab) phlebectomies performed at intervals along the course of the thigh and knee varicose veins. IMPRESSION: 1. Technically successful ambulatory phlebectomies, right lower extremity. Will see her back in 1 week for routine follow-up. Assuming she does well, will see her back at 1 month and assess the need for any additional sclerotherapy of the right lower extremity. After this, she will need left lower extremity treatment. Electronically Signed   By: Corlis Leak  Sakeena Teall M.D.   On: 09/20/2015 15:49    Labs:  CBC:  Recent Labs  01/02/15 0912  WBC 13.3*  HGB 14.2  HCT 43.0  PLT 175.0    COAGS: No results for input(s): INR, APTT in the last 8760 hours.  BMP:  Recent Labs  01/02/15 0912  NA 137  K 3.4*  CL 96  CO2 37*  GLUCOSE 52*  BUN 26*  CALCIUM 10.0  CREATININE 0.82    LIVER FUNCTION TESTS: No results for input(s):  BILITOT, AST, ALT, ALKPHOS, PROT, ALBUMIN in the last 8760 hours.  TUMOR MARKERS: No results for input(s): AFPTM, CEA, CA199, CHROMGRNA in the last 8760 hours.  Assessment and Plan:  My impression is that she's responded well post ambulatory phlebectomy of recurrent right thigh varicose veins. Ultrasound does show some residual patent segments in the thigh. The residual dominant varicose veins in the thigh should now respond better to directed sclerotherapy. We reviewed the expectation that by treating these dominant refluxing thigh varicose veins we expect decompression of her lower leg varicose veins and improvement in her associated symptoms. We'll plan to see her back at her convenience for right lower extremity focused ultrasound-guided sclerotherapy, which can be performed concurrently with left lower extremity ambulatory phlebectomy as she'll need to coordinate holding her Xarelto for the procedure  as before.    A copy of this report was sent to the requesting provider on this date.  Signed: Mikya Don III, DAYNE Cassey Hurrell 10/05/2015, 9:52 AM   I spent a total of    15 Minutes in face to face in clinical consultation, greater than 50% of which was counseling/coordinating care for symptomatic bilateral lower extremity varicose veins.

## 2015-11-08 ENCOUNTER — Ambulatory Visit
Admission: RE | Admit: 2015-11-08 | Discharge: 2015-11-08 | Disposition: A | Discharge status: Home / Self Care | Payer: Medicare Other | Source: Ambulatory Visit | Attending: Interventional Radiology | Admitting: Interventional Radiology

## 2015-11-08 DIAGNOSIS — I8393 Asymptomatic varicose veins of bilateral lower extremities: Secondary | ICD-10-CM

## 2015-11-08 MED ORDER — DIAZEPAM 10 MG PO TABS: 10.0000 mg | ORAL_TABLET | Freq: Once | ORAL | Status: DC

## 2015-11-08 NOTE — Progress Notes (Signed)
1240 Informed Consent obtained.  Patient given verbal & written discharge instructions.  1250  Valium 10 mg po.  1255  22 gauge x 1" insyte catheter started IV Left antecubital (x 1 attempt) w/ Saline lock & 7"extension.  Flushed w/ 10 mL NSS without difficulty.  Site unremarkable.  1325  Procedure started.  1545  Procedure completed.  Patient wearing thigh high graduated compression garment on LLE (20-30 mm Hg).  1550  IV discontinued Left antecubital, catheter intact.  Site unremarkable.  Ambulated x 10 mins, without assistance.  Gait steady.  1610  Peggy (friend of patient) here to provide transportation home.  Patient discharged to home.  Thera Basden Carmell Austria, RN 11/08/2015 4:08 PM

## 2015-11-15 ENCOUNTER — Telehealth: Payer: Self-pay | Admitting: Interventional Cardiology

## 2015-11-15 DIAGNOSIS — I482 Chronic atrial fibrillation, unspecified: Secondary | ICD-10-CM

## 2015-11-15 DIAGNOSIS — I5032 Chronic diastolic (congestive) heart failure: Secondary | ICD-10-CM

## 2015-11-15 DIAGNOSIS — I252 Old myocardial infarction: Secondary | ICD-10-CM

## 2015-11-15 NOTE — Telephone Encounter (Signed)
New problem    Pt has questions concerning her needing labs done before her appt.

## 2015-11-15 NOTE — Telephone Encounter (Signed)
CMet, CBC, lipids. THanks.

## 2015-11-15 NOTE — Telephone Encounter (Signed)
Pt calling to ask Dr Eldridge Dace if she can have her labs done prior to her 1 year OV with him on 04/01/16.  Pt states she prefers to have her labs done prior to her follow-up appt, for she likes to address any concerns with her lab results with Dr Eldridge Dace in person.  Informed the pt that I will need to send this message to Dr Eldridge Dace for further review, recommendation, and lab orders.  Informed the pt that once Dr Eldridge Dace advises on this, a nurse from our office will follow-up with her shortly thereafter.  Pt verbalized understanding and agrees with this plan.  Pt gracious for all the assistance provided.

## 2015-11-16 NOTE — Telephone Encounter (Signed)
Spoke with pt and informed her of labs that Dr. Eldridge Dace would like to get.   Scheduled pt for labs on 6/5. Pt verbalized understanding and was in agreement with this plan.

## 2015-11-23 ENCOUNTER — Other Ambulatory Visit (HOSPITAL_COMMUNITY): Payer: Self-pay | Admitting: Interventional Radiology

## 2015-11-23 ENCOUNTER — Ambulatory Visit
Admission: RE | Admit: 2015-11-23 | Discharge: 2015-11-23 | Disposition: A | Discharge status: Home / Self Care | Payer: Medicare Other | Source: Ambulatory Visit | Attending: Interventional Radiology | Admitting: Interventional Radiology

## 2015-11-23 DIAGNOSIS — I8393 Asymptomatic varicose veins of bilateral lower extremities: Secondary | ICD-10-CM

## 2015-11-23 NOTE — Consult Note (Signed)
Chief Complaint: Patient was seen in consultation today for No chief complaint on file.  at the request of Lebaron Bautch  Referring Physician(s): Leniyah Martell  History of Present Illness: [name] Diaz is a 82 y.o. female   with a long history of symptomatic bilateral lower extremity varicose veins, post bilateral GSD stripping in the 60s, left ovarian vein embolization for pelvic congestion syndrome and symptomatic vulvar veins in January of this year, recurrent bilateral lower extremity varicose veins, post ambulatory phlebectomy of right thigh dominant refluxing superficial varicose veins 09/20/2015. She had follow-up ultrasound-guided foam sclerotherapy of some residual right lower extremity varicose veins on 11/08/2015, followed by ambulatory phlebectomy of dominant medial left thigh and posterior lateral knee refluxing varicose vein trunks. She's done well post procedure. Usual bruising around the treatment site. A few palpable tender  superficial varicose veins, medial left thigh.  She is using her compression hose faithfully during the day. No new symptoms. She says her legs don't hurt.  Past Medical History  Diagnosis Date  . Myocardial infarction [facility]) 2011    DES TO LAD  . Dysrhythmia     Atrial fib  . Shortness of breath     on exertion  . A-fib   . History of colon polyps   . Colonic diverticular disease   . History of cataract surgery     with lens implant     Past Surgical History  Procedure Laterality Date  . Vein ligation  1960s  . Hysteroscopy w/d&c  08/25/2012    Procedure: DILATATION AND CURETTAGE /HYSTEROSCOPY;  Surgeon: Geryl Rankins, MD;  Location: [facility];  Service: Gynecology;  Laterality: N/A;  TruClear  . Eye surgery      Bilateral Cataract surgery w/ lens implant      Allergies: Clindamycin/lincomycin  Medications: Prior to Admission medications   Medication Sig Start Date End Date Taking? Authorizing Provider  calcium carbonate (CALCIUM  600) 600 MG TABS tablet Take 1 tablet by mouth.    Historical Provider, MD  Calcium Carbonate-Vitamin D (CALTRATE 600+D) 600-400 MG-UNIT per tablet Take 1 tablet by mouth daily.     Historical Provider, MD  cycloSPORINE (RESTASIS) 0.05 % ophthalmic emulsion Place 1 drop into both eyes 2 (two) times daily.    Historical Provider, MD  diazepam (VALIUM) 10 MG tablet Take 1 tablet (10 mg total) by mouth once. 09/20/15   Oley Balm, MD  diazepam (VALIUM) 10 MG tablet Take 1 tablet (10 mg total) by mouth once. 11/08/15   Oley Balm, MD  diazepam (VALIUM) 5 MG tablet Take 5 mg by mouth at bedtime as needed. For anxiety    Historical Provider, MD  fish oil-omega-3 fatty acids 1000 MG capsule Take 3 g by mouth daily.    Historical Provider, MD  furosemide (LASIX) 20 MG tablet TAKE TWO TABLETS (40 MG ) BY MOUTH ONCE DAILY AS DIRECTED. 07/21/15   Corky Crafts, MD  hydrocortisone cream 1 % Apply 1 application topically 2 (two) times daily as needed. For insect bites    Historical Provider, MD  metoprolol (LOPRESSOR) 100 MG tablet TAKE 1 TABLET (100 MG TOTAL) BY MOUTH 2 (TWO) TIMES DAILY. 05/29/15   Corky Crafts, MD  Multiple Vitamin (MULTIVITAMIN WITH MINERALS) TABS Take 1 tablet by mouth daily.    Historical Provider, MD  nitroGLYCERIN (NITROSTAT) 0.4 MG SL tablet Place 1 tablet (0.4 mg total) under the tongue every 5 (five) minutes as needed for chest pain. 03/29/14   Renelda Loma  Hoyle Barr, MD  rivaroxaban (XARELTO) 20 MG TABS tablet Take 1 tablet (20 mg total) by mouth daily with supper. 07/21/15   Corky Crafts, MD  simvastatin (ZOCOR) 20 MG tablet TAKE 1 TABLET (20 MG TOTAL) BY MOUTH AT BEDTIME. 05/29/15   Rosalio Macadamia, NP     Family History  Problem Relation Age of Onset  . CVA Mother     Social History   Social History  . Marital Status: Widowed    Spouse Name: N/A  . Number of Children: N/A  . Years of Education: N/A   Social History Main Topics  . Smoking status: Former  Smoker -- 40 years  . Smokeless tobacco: Not on file  . Alcohol Use: No  . Drug Use: No  . Sexual Activity: Not on file   Other Topics Concern  . Not on file   Social History Narrative    ECOG Status: 1 - Symptomatic but completely ambulatory  Review of Systems: A 12 point ROS discussed and pertinent positives are indicated in the HPI above.  All other systems are negative.  Review of Systems  Vital Signs: There were no vitals taken for this visit.  Physical Exam  Mallampati Score:     Imaging: US Venous Img Lower Unilateral Left  11/23/2015  CLINICAL DATA:  long history of lower extremity varicose veins. She had bilateral GSD vein stripping back in 1966 due to severe bilateral pain, which relieved the symptoms at that time. She then developed bleeding vulvar veins, and was seen by IR in [place], and underwent left ovarian vein embolization for pelvic congestion syndrome and reflux in January 2016. She has had no further problems with vulvar varicosities.  her lower extremity pain reoccurred shortly after this procedure. She complains of bilateral lower extremity edema, pain, tiredness, skin color changes, spider and varicose veins. No history of venous ulceration, DVT, or PE. There is a history of varicose/spider veins in her mother. She is G2 P2. 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 She does not use any pain medications for the varicose/spider veins. She does feel that elevation helps significantly. Good relief from ambulatory phlebectomy, right lower extremity, 09/20/2015. She underwent ultrasound-directed foam sclerotherapy of some residual symptomatic varicose veins in the right leg as well as ambulatory phlebectomy of the left lower extremity on 11/08/2015 and presents for scheduled follow-up. TECHNIQUE: LEFT LOWER EXTREMITY VENOUS DOPPLER ULTRASOUND Gray-scale sonography with compression as well as color and  duplex Doppler ultrasound were performed to evaluate the saphenous vein and the deep venous system from the level of the common femoral vein through the popliteal and proximal calf veins. FINDINGS: The lower extremity deep venous system demonstrates normal compressibility, phasicity, and augmentation. Posterior tibial vein unremarkable. No evidence of DVT. The greater saphenous vein is surgically absent. There a few persistent patent varicose veins in the medial thigh and calf, as well as thrombosed segments in the medial thigh from recent ambulatory phlebectomy. IMPRESSION: 1. No evident complication post ambulatory phlebectomy. Some persistent patent varicose veins in the medial calf and thigh are noted. 2. Normal deep venous system. No DVT. Electronically Signed   By: Corlis Leak M.D.   On: 11/23/2015 09:55   [place] Injec Sclerotherapy Single  11/09/2015  CLINICAL DATA:  Persistent symptomatic varicose veins of the right thigh and calf post ambulatory phlebectomy and remote stripping of the refluxing greater saphenous vein. EXAM: ULTRASOUND INJECTION  SCLEROTHERAPHY SINGLE TECHNIQUE: Survey ultrasound of the right lower extremity was performed and appropriate skin entry sites were marked. Region prepped with chlorhexidine, and under direct ultrasound guidance a foam of 2 ml sodium tetradecyl sulfate was injected in small aliquots into the residual patent symptomatic varicose veins until there was good filling of the affected regions. The patient tolerated the procedure well, with no immediate complication. COMPARISON:  None IMPRESSION: 1. Technically successful foam sclerotherapy of residual symptomatic right lower extremity varicose veins. Follow-up 1 month. Electronically Signed   By: Corlis Leak M.D.   On: 11/09/2015 09:05   [place] Stab Phlebectomy 10-20 Stabs  11/09/2015  CLINICAL DATA:  long history of lower extremity varicose veins. She had bilateral GSD vein stripping back in 1966 due to severe bilateral pain,  which relieved the symptoms at that time. She then developed bleeding vulvar veins, and was seen by IR in [place], and underwent left ovarian vein embolization for pelvic congestion syndrome and reflux in January 2016. She has had no further problems with vulvar varicosities. SHe states that her lower extremity pain reoccurred shortly after this procedure. She complains of bilateral lower extremity edema, pain, tiredness, skin color changes, spider and varicose veins. She's tried knee-high compression hose but these bind her about the calf, and she does not use them now. No history of venous ulceration, DVT, or PE. There is a history of varicose/spider veins in her mother. She is G2 P2. 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 She does not use any pain medications for the varicose/spider veins. She does feel that elevation helps significantly. Good relief from ambulatory phlebectomy, right lower extremity, 09/20/2015 EXAM: ULTRAOUND STAB PHLEBECTOMY 10-20 STABS TECHNIQUE: The procedure, risks (including but not limited to bleeding, infection, organ damage ), benefits, and alternatives were explained to the patient. Questions regarding the procedure were encouraged and answered. The patient understands and consents to the procedure. A time-out process was followed. No preprocedural antibiotics wereindicated. Ultrasound was used to identify and mark the target varicose veins in the left medial thigh and posterior proximal knee and calf. Region prepped with chlorhexidine, draped in usual sterile fashion. 20 mL lidocaine 1% diluted with 30 mL sterile saline utilized for regional tumescent anesthesia. Ambulatory (stab) phlebectomies of the left thigh and knee were performed using up to 20 stabs . The stab phlebectomies were performed with surgical hooks through #11 punctures and they were very satisfactory. Hemostasis achieved with compression. The leg  was then cleansed, a sterile dressing applied, and compression hose placed. The patient tolerated the procedure well and was sent to recover in satisfactory condition. COMPARISON:  Ultrasound 10/05/2015 and previous FINDINGS: Tortuous superficial varicose veins in the anteromedial thigh extending to the medial knee and distally in the lateral and posterior knee and calf were again identified. Ambulatory (stab) phlebectomies performed at intervals along the course of the thigh and knee varicose veins with disruption of the venous networks at the treated sites. IMPRESSION: 1. Technically successful ambulatory phlebectomy of medial left thigh and posterolateral knee. Will see her back in 1 week for routine follow-up. Assuming she does well, will see her back at 1 month and assess the need for any additional sclerotherapy of the left lower extremity. Electronically Signed   By: Corlis Leak M.D.   On: 11/09/2015 09:03    Labs:  CBC:  Recent Labs  01/02/15 0912  WBC 13.3*  HGB 14.2  HCT  43.0  PLT 175.0    COAGS: No results for input(s): INR, APTT in the last 8760 hours.  BMP:  Recent Labs  01/02/15 0912  NA 137  K 3.4*  CL 96  CO2 37*  GLUCOSE 52*  BUN 26*  CALCIUM 10.0  CREATININE 0.82    LIVER FUNCTION TESTS: No results for input(s): BILITOT, AST, ALT, ALKPHOS, PROT, ALBUMIN in the last 8760 hours.  TUMOR MARKERS: No results for input(s): AFPTM, CEA, CA199, CHROMGRNA in the last 8760 hours.  Assessment and Plan:  My impression is that she's done well post ambulatory phlebectomy of left lower extremity varicose veins 2 weeks ago. Her leg pain is responding nicely. There are a few residual patent varicose veins inferior to the   treatment levels. As she's currently pain-free, we'll watch these and see her back in about 6 months. If these persist significantly, we can certainly approach disease with ultrasound-guided directed foam sclerotherapy as we have done previously on the  right. She knows to call in the interval with any questions or problems.  Thank you for this interesting consult.  I greatly enjoyed meeting DAKISHA SCHOOF and look forward to participating in their care.  A copy of this report was sent to the requesting provider on this date.  Electronically Signed: Carly Sabo III, DAYNE Amirr Achord 11/23/2015, 10:53 AM   I spent a total of    25 Minutes in face to face in clinical consultation, greater than 50% of which was counseling/coordinating care for bilateral lower extremity varicose veins.

## 2015-12-27 ENCOUNTER — Ambulatory Visit
Admission: RE | Admit: 2015-12-27 | Discharge: 2015-12-27 | Disposition: A | Discharge status: Home / Self Care | Payer: Medicare Other | Source: Ambulatory Visit | Attending: Interventional Radiology | Admitting: Interventional Radiology

## 2015-12-27 ENCOUNTER — Other Ambulatory Visit (HOSPITAL_COMMUNITY): Payer: Self-pay | Admitting: Interventional Radiology

## 2015-12-27 DIAGNOSIS — I8393 Asymptomatic varicose veins of bilateral lower extremities: Secondary | ICD-10-CM

## 2015-12-27 NOTE — Progress Notes (Signed)
Patient ID: [name] Diaz, female   DOB: 1934/09/29, 82 y.o.   MRN: [id number]       Chief Complaint: Patient was seen in  today for scheduled follow-up  at the request of Brittne Kawasaki  Referring Physician(s): Jo-Ann Johanning  History of Present Illness: [name] Diaz is a 82 y.o. female with a long history of symptomatic bilateral lower extremity varicose veins, post bilateral GSD stripping in the 60s, left ovarian vein embolization for pelvic congestion syndrome and symptomatic vulvar veins in January of this year, recurrent bilateral lower extremity varicose veins, post ambulatory phlebectomy of right thigh dominant refluxing superficial varicose veins 09/20/2015. She had follow-up ultrasound-guided foam sclerotherapy of some residual right lower extremity varicose veins on 11/08/2015, followed by ambulatory phlebectomy of dominant medial left thigh and posterior lateral knee refluxing varicose vein trunks. She's done well post procedure. Resolution of tenderness and bruising around the treatment site. A few palpable thrombosed varicose veins, lateral right thigh.Persistent visible tortuous varicose vein, posterior medial left thigh, and a smaller one along the lateral left calf. She is no longer using her compression hose. She is using her compression hose faithfully during the day. No new symptoms. She says the left  anterior thigh varicose veins are significantly improved.  Past Medical History  Diagnosis Date  . Myocardial infarction [facility]) 2011    DES TO LAD  . Dysrhythmia     Atrial fib  . Shortness of breath     on exertion  . A-fib   . History of colon polyps   . Colonic diverticular disease   . History of cataract surgery     with lens implant     Past Surgical History  Procedure Laterality Date  . Vein ligation  1960s  . Hysteroscopy w/d&c  08/25/2012    Procedure: DILATATION AND CURETTAGE /HYSTEROSCOPY;  Surgeon: Geryl Rankins, MD;  Location: [facility];  Service: Gynecology;   Laterality: N/A;  TruClear  . Eye surgery      Bilateral Cataract surgery w/ lens implant      Allergies: Clindamycin/lincomycin  Medications: Prior to Admission medications   Medication Sig Start Date End Date Taking? Authorizing Provider  calcium carbonate (CALCIUM 600) 600 MG TABS tablet Take 1 tablet by mouth.    Historical Provider, MD  Calcium Carbonate-Vitamin D (CALTRATE 600+D) 600-400 MG-UNIT per tablet Take 1 tablet by mouth daily.     Historical Provider, MD  cycloSPORINE (RESTASIS) 0.05 % ophthalmic emulsion Place 1 drop into both eyes 2 (two) times daily.    Historical Provider, MD  diazepam (VALIUM) 10 MG tablet Take 1 tablet (10 mg total) by mouth once. 09/20/15   Oley Balm, MD  diazepam (VALIUM) 10 MG tablet Take 1 tablet (10 mg total) by mouth once. 11/08/15   Oley Balm, MD  diazepam (VALIUM) 5 MG tablet Take 5 mg by mouth at bedtime as needed. For anxiety    Historical Provider, MD  fish oil-omega-3 fatty acids 1000 MG capsule Take 3 g by mouth daily.    Historical Provider, MD  furosemide (LASIX) 20 MG tablet TAKE TWO TABLETS (40 MG ) BY MOUTH ONCE DAILY AS DIRECTED. 07/21/15   Corky Crafts, MD  hydrocortisone cream 1 % Apply 1 application topically 2 (two) times daily as needed. For insect bites    Historical Provider, MD  metoprolol (LOPRESSOR) 100 MG tablet TAKE 1 TABLET (100 MG TOTAL) BY MOUTH 2 (TWO) TIMES DAILY. 05/29/15   Corky Crafts, MD  Multiple Vitamin (  MULTIVITAMIN WITH MINERALS) TABS Take 1 tablet by mouth daily.    Historical Provider, MD  nitroGLYCERIN (NITROSTAT) 0.4 MG SL tablet Place 1 tablet (0.4 mg total) under the tongue every 5 (five) minutes as needed for chest pain. 03/29/14   Corky Crafts, MD  rivaroxaban (XARELTO) 20 MG TABS tablet Take 1 tablet (20 mg total) by mouth daily with supper. 07/21/15   Corky Crafts, MD  simvastatin (ZOCOR) 20 MG tablet TAKE 1 TABLET (20 MG TOTAL) BY MOUTH AT BEDTIME. 05/29/15   Rosalio Macadamia, NP     Family History  Problem Relation Age of Onset  . CVA Mother     Social History   Social History  . Marital Status: Widowed    Spouse Name: N/A  . Number of Children: N/A  . Years of Education: N/A   Social History Main Topics  . Smoking status: Former Smoker -- 40 years  . Smokeless tobacco: Not on file  . Alcohol Use: No  . Drug Use: No  . Sexual Activity: Not on file   Other Topics Concern  . Not on file   Social History Narrative    ECOG Status: 1 - Symptomatic but completely ambulatory  Review of Systems: A 12 point ROS discussed and pertinent positives are indicated in the HPI above.  All other systems are negative.  Review of Systems  Vital Signs: There were no vitals taken for this visit.  Physical Exam  Musculoskeletal:       Legs:   Mallampati Score:     Imaging: US Venous Img Lower Unilateral Left  12/27/2015  CLINICAL DATA:  long history of lower extremity varicose veins. She had bilateral GSD vein stripping back in 1966 due to severe bilateral pain, which relieved the symptoms at that time. She then developed bleeding vulvar veins, and was seen by IR in [place], and underwent left ovarian vein embolization for pelvic congestion syndrome and reflux in January 2016. She has had no further problems with vulvar varicosities. her lower extremity pain reoccurred shortly after this procedure. She complains of bilateral lower extremity edema, pain, tiredness, skin color changes, spider and varicose veins. No history of venous ulceration, DVT, or PE. There is a history of varicose/spider veins in her mother. She is G2 P2. 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 She does not use any pain medications for the varicose/spider veins. She does feel that elevation helps significantly. Good relief from ambulatory phlebectomy, right lower extremity, 09/20/2015. She underwent  ultrasound-directed foam sclerotherapy of some residual symptomatic varicose veins in the right leg as well as ambulatory phlebectomy of the left lower extremity on 11/08/2015 and presents for scheduled follow-up. EXAM: LEFT LOWER EXTREMITY VENOUS DUPLEX ULTRASOUND TECHNIQUE: Gray-scale sonography with graded compression, as well as color Doppler and duplex ultrasound, were performed to evaluate the deep and superficial veins of left lower extremity. Spectral Doppler was utilized to evaluate flow at rest and with distal augmentation maneuvers. A complete superficial venous insufficiency exam was performed in the upright standing position. I personally performed the technical portion of the exam. COMPARISON:  11/23/2015 and previous FINDINGS: Deep Venous System: Evaluation of the deep venous system including the common femoral, femoral, profunda femoral, popliteal and calf veins (where visible) demonstrate no evidence of deep venous thrombosis. The vessels are compressible and demonstrate normal respiratory phasicity and response to augmentation. No evidence of the deep venous reflux.  Superficial Venous System: LEFT SFJ: Patent, compressible GSV surgically absent throughout its course. Significant decompression of varicose veins in the anterior and medial thigh. There is a residual patent tortuous varicose vein along the posteromedial aspect of the thigh, and a smaller tortuous visible varicose vein along the lateral aspect of the knee and calf. IMPRESSION: 1. Decompression of the anterior and medial left thigh varicose veins post ambulatory phlebectomy, without apparent complication. 2. Persistent patency of posterolateral thigh and lateral calf dominant varicose vein segments. 3. Normal deep venous system, no DVT. Electronically Signed   By: Corlis Leak M.D.   On: 12/27/2015 11:00    Labs:  CBC:  Recent Labs  01/02/15 0912  WBC 13.3*  HGB 14.2  HCT 43.0  PLT 175.0    COAGS: No results for input(s):  INR, APTT in the last 8760 hours.  BMP:  Recent Labs  01/02/15 0912  NA 137  K 3.4*  CL 96  CO2 37*  GLUCOSE 52*  BUN 26*  CALCIUM 10.0  CREATININE 0.82    LIVER FUNCTION TESTS: No results for input(s): BILITOT, AST, ALT, ALKPHOS, PROT, ALBUMIN in the last 8760 hours.  TUMOR MARKERS: No results for input(s): AFPTM, CEA, CA199, CHROMGRNA in the last 8760 hours.  Assessment and Plan:  My impression is that she's done very well post ambulatory phlebectomy now on the left, without complication. There's been significant decompression of her symptomatic thigh varicose veins, and symptomatic relief. There   are still  Patent posterior medial thigh and lateral calf varicose veins, regions of persistent mild symptomatology. We discussed options of conservative observation of these versus proceeding onto ultrasound-guided directed foam injection sclerotherapy, similar to what was done on the contralateral side with good success. She is inclined to go ahead and proceed with sclerotherapy of these residual dominant varicose veins. We'll have to have her off her anticoagulation as before, and can set this up with her on an elective basis. She'll need to start using her compression hose again.  Thank you for this interesting consult.  I greatly enjoyed meeting VIOLETTE HELLMANN and look forward to participating in their care.  A copy of this report was sent to the requesting provider on this date.  Electronically Signed: Brayden Brodhead III, DAYNE Earon Rivest 12/27/2015, 11:08 AM   I spent a total of    25 Minutes in face to face in clinical consultation, greater than 50% of which was counseling/coordinating care for bilateral lower extremity varicose veins.

## 2016-01-17 ENCOUNTER — Ambulatory Visit
Admission: RE | Admit: 2016-01-17 | Discharge: 2016-01-17 | Disposition: A | Discharge status: Home / Self Care | Payer: Medicare Other | Source: Ambulatory Visit | Attending: Interventional Radiology | Admitting: Interventional Radiology

## 2016-01-17 DIAGNOSIS — I8393 Asymptomatic varicose veins of bilateral lower extremities: Secondary | ICD-10-CM

## 2016-01-30 ENCOUNTER — Other Ambulatory Visit: Payer: Self-pay | Admitting: Nurse Practitioner

## 2016-02-26 ENCOUNTER — Other Ambulatory Visit: Payer: Self-pay | Admitting: Interventional Cardiology

## 2016-02-29 ENCOUNTER — Ambulatory Visit
Admission: RE | Admit: 2016-02-29 | Discharge: 2016-02-29 | Disposition: A | Discharge status: Home / Self Care | Payer: Medicare Other | Source: Ambulatory Visit | Attending: Interventional Radiology | Admitting: Interventional Radiology

## 2016-02-29 NOTE — Progress Notes (Signed)
Patient ID: [name] Diaz, female   DOB: 01-19-1934, 82 y.o.   MRN: [id number]       Chief Complaint: Patient was seen in consultation today for follow-up post sclerotherapy  at the request of Cierrah Dace  Referring Physician(s): Austina Constantin  History of Present Illness: [name] Diaz is a 82 y.o. female with a long history of symptomatic bilateral lower extremity varicose veins, post bilateral GSD stripping in the 60s, left ovarian vein embolization for pelvic congestion syndrome and symptomatic vulvar veins in January of this year, recurrent bilateral lower extremity varicose veins, post ambulatory phlebectomy of right thigh dominant refluxing superficial varicose veins 09/20/2015. She had follow-up ultrasound-guided foam sclerotherapy of some residual right lower extremity varicose veins on 11/08/2015, followed by ambulatory phlebectomy of dominant medial left thigh and posterior lateral knee refluxing varicose vein trunks. She had sclerotherapy of residual left varicose veins 01/17/2016.  She's done well post procedure. No tenderness /bruising around the treatment site.  Persistent visible tortuous varicose vein, posterior medial left thigh, and a smaller one along the lateral left calf. She is no longer using her compression after 5 weeks of post procedure faithfully use. No new symptoms. Her initial presenting  bilateral leg pain has resolved. Past Medical History  Diagnosis Date  . Myocardial infarction [facility]) 2011    DES TO LAD  . Dysrhythmia     Atrial fib  . Shortness of breath     on exertion  . A-fib   . History of colon polyps   . Colonic diverticular disease   . History of cataract surgery     with lens implant     Past Surgical History  Procedure Laterality Date  . Vein ligation  1960s  . Hysteroscopy w/d&c  08/25/2012    Procedure: DILATATION AND CURETTAGE /HYSTEROSCOPY;  Surgeon: Geryl Rankins, MD;  Location: [facility];  Service: Gynecology;  Laterality: N/A;  TruClear    . Eye surgery      Bilateral Cataract surgery w/ lens implant      Allergies: Clindamycin/lincomycin  Medications: Prior to Admission medications   Medication Sig Start Date End Date Taking? Authorizing Provider  calcium carbonate (CALCIUM 600) 600 MG TABS tablet Take 1 tablet by mouth.    Historical Provider, MD  Calcium Carbonate-Vitamin D (CALTRATE 600+D) 600-400 MG-UNIT per tablet Take 1 tablet by mouth daily.     Historical Provider, MD  cycloSPORINE (RESTASIS) 0.05 % ophthalmic emulsion Place 1 drop into both eyes 2 (two) times daily.    Historical Provider, MD  diazepam (VALIUM) 10 MG tablet Take 1 tablet (10 mg total) by mouth once. 09/20/15   Oley Balm, MD  diazepam (VALIUM) 10 MG tablet Take 1 tablet (10 mg total) by mouth once. 11/08/15   Oley Balm, MD  diazepam (VALIUM) 5 MG tablet Take 5 mg by mouth at bedtime as needed. For anxiety    Historical Provider, MD  fish oil-omega-3 fatty acids 1000 MG capsule Take 3 g by mouth daily.    Historical Provider, MD  furosemide (LASIX) 20 MG tablet TAKE TWO TABLETS (40 MG ) BY MOUTH ONCE DAILY AS DIRECTED. 07/21/15   Corky Crafts, MD  hydrocortisone cream 1 % Apply 1 application topically 2 (two) times daily as needed. For insect bites    Historical Provider, MD  metoprolol (LOPRESSOR) 100 MG tablet TAKE 1 TABLET BY MOUTH TWICE A DAY 02/27/16   Corky Crafts, MD  Multiple Vitamin (MULTIVITAMIN WITH MINERALS) TABS Take 1 tablet by  mouth daily.    Historical Provider, MD  nitroGLYCERIN (NITROSTAT) 0.4 MG SL tablet Place 1 tablet (0.4 mg total) under the tongue every 5 (five) minutes as needed for chest pain. 03/29/14   Corky Crafts, MD  simvastatin (ZOCOR) 20 MG tablet TAKE 1 TABLET BY MOUTH AT BEDTIME 01/31/16   Rosalio Macadamia, NP  XARELTO 20 MG TABS tablet TAKE 1 TABLET (20 MG TOTAL) BY MOUTH DAILY WITH SUPPER. 02/27/16   Corky Crafts, MD     Family History  Problem Relation Age of Onset  . CVA Mother      Social History   Social History  . Marital Status: Widowed    Spouse Name: N/A  . Number of Children: N/A  . Years of Education: N/A   Social History Main Topics  . Smoking status: Former Smoker -- 40 years  . Smokeless tobacco: Not on file  . Alcohol Use: No  . Drug Use: No  . Sexual Activity: Not on file   Other Topics Concern  . Not on file   Social History Narrative    ECOG Status: 1 - Symptomatic but completely ambulatory    Review of Systems Review of Systems: A 12 point ROS discussed and pertinent positives are indicated in the HPI above.  All other systems are negative. Vital Signs: There were no vitals taken for this visit.  Physical Exam residual palpable varicose veins along the medial left thigh and calf. No skin ulceration. No tenderness.       Imaging: US Venous Img Lower Unilateral Left  02/29/2016  CLINICAL DATA:  long history of lower extremity varicose veins. She had bilateral GSD vein stripping back in 1966 due to severe bilateral pain, which relieved the symptoms at that time. She then developed bleeding vulvar veins, and was seen by IR in [place], and underwent left ovarian vein embolization for pelvic congestion syndrome and reflux in January 2016. She has had no further problems with vulvar varicosities. SHe states that her lower extremity pain reoccurred shortly after this procedure. She complains of bilateral lower extremity edema, pain, tiredness, skin color changes, spider and varicose veins. She's tried knee-high compression hose but these bind her about the calf, and she does not use them now. No history of venous ulceration, DVT, or PE. There is a history of varicose/spider veins in her mother. She is G2 P2. 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 She does not use any pain medications for the varicose/spider veins. She does feel that elevation helps significantly. Good relief  from ambulatory phlebectomy, right lower extremity, 09/20/2015, with follow-up sclerotherapy. Partial symptomatic relief from left lower extremity ambulatory phlebectomy, with some residual medial thigh and lateral calf varicose symptomatic varicose veins. She underwent follow-up foam sclerotherapy of residual symptomatic left lower extremity varicose veins 01/17/2016. She used her a compression hose for about 5 weeks. She is not having any leg pain. She has persistent visible left varicose veins. EXAM: LEFT LOWER EXTREMITY VENOUS DUPLEX ULTRASOUND TECHNIQUE: Gray-scale sonography with graded compression, as well as color Doppler and duplex ultrasound, were performed to evaluate the deep and superficial veins of left lower extremity. Spectral Doppler was utilized to evaluate flow at rest and with distal augmentation maneuvers. A complete superficial venous insufficiency exam was performed in the upright standing position. I personally performed the technical portion of the exam. COMPARISON:  None. FINDINGS: Deep Venous System: Evaluation of the  deep venous system including the common femoral, femoral, profunda femoral, popliteal and calf veins (where visible) demonstrate no evidence of deep venous thrombosis. The vessels are compressible and demonstrate normal respiratory phasicity and response to augmentation. No evidence of the deep venous reflux. Superficial Venous System: Large compressible patent varicose veins extend from the distal thigh to the mid calf. Partially occluded thrombosed varicose veins in the posterior distal thigh, region of foam sclerotherapy. IMPRESSION: 1. Incomplete thrombosis of extensive large thigh and calf varicose veins post foam injection sclerotherapy. 2. Negative for left lower extremity DVT. Electronically Signed   By: Corlis Leak M.D.   On: 02/29/2016 12:15       Assessment and Plan:  My impression is that she has had only partial durable success after foam injection  sclerotherapy of residual left lower extremity varicose veins. In summary, she feels that the ambulatory phlebectomy bilaterally offered  the most symptomatic relief. Notably, her initial presenting bilateral leg pain has resolved. I think the fact that she is on anticoagulation most the time probably contributes to the poor success of foam injection sclerotherapy. Accordingly, we will not plan to pursue foam sclerotherapy further. She's happy with her symptomatic relief at this time. She will use her graduated compression hose as needed. Should she have progression of residual varicose veins and symptomatic recurrence, we can certainly entertain repeat ambulatory phlebectomy. In the meantime, I have no activity restrictions for her. She will plan to stay on her anticoagulation for atrial fibrillation. We will not schedule a follow-up visit for now. She will contact her office should symptoms recur and we can see her at that time.  Thank you for this interesting consult.  I greatly enjoyed meeting [name] Diaz and look forward to participating in their care.  A copy of this report was sent to the requesting provider on this date.  Electronically Signed: Olga Seyler III, DAYNE Vuong Musa 02/29/2016, 2:06 PM   I spent a total of    25 Minutes in face to face in clinical consultation, greater than 50% of which was counseling/coordinating care for bilateral lower extremity symptomatic varicose veins.

## 2016-03-25 ENCOUNTER — Other Ambulatory Visit: Payer: Self-pay | Admitting: Interventional Cardiology

## 2016-03-25 ENCOUNTER — Other Ambulatory Visit (INDEPENDENT_AMBULATORY_CARE_PROVIDER_SITE_OTHER): Payer: Medicare Other | Admitting: *Deleted

## 2016-03-25 DIAGNOSIS — I482 Chronic atrial fibrillation, unspecified: Secondary | ICD-10-CM

## 2016-03-25 DIAGNOSIS — I5032 Chronic diastolic (congestive) heart failure: Secondary | ICD-10-CM

## 2016-03-25 DIAGNOSIS — I252 Old myocardial infarction: Secondary | ICD-10-CM

## 2016-03-25 LAB — LIPID PANEL
CHOL/HDL RATIO: 1.7 ratio (ref <=5.0)
CHOLESTEROL: 138 mg/dL (ref 125–200)
HDL: 80 mg/dL (ref >=46)
LDL Cholesterol: 48 mg/dL (ref <130)
TRIGLYCERIDES: 52 mg/dL (ref <150)
VLDL: 10 mg/dL (ref <30)

## 2016-03-25 LAB — CBC
HCT: 39.8 % (ref 35.0–45.0)
HEMOGLOBIN: 13 g/dL (ref 11.7–15.5)
MCH: 30.6 pg (ref 27.0–33.0)
MCHC: 32.7 g/dL (ref 32.0–36.0)
MCV: 93.6 fL (ref 80.0–100.0)
MPV: 10.6 fL (ref 7.5–12.5)
PLATELETS: 162 10*3/uL (ref 140–400)
RBC: 4.25 MIL/uL (ref 3.80–5.10)
RDW: 14 % (ref 11.0–15.0)
WBC: 12.5 10*3/uL — ABNORMAL HIGH (ref 3.8–10.8)

## 2016-03-25 LAB — COMPREHENSIVE METABOLIC PANEL
ALBUMIN: 4.3 g/dL (ref 3.6–5.1)
ALK PHOS: 89 U/L (ref 33–130)
ALT: 25 U/L (ref 6–29)
AST: 25 U/L (ref 10–35)
BILIRUBIN TOTAL: 0.6 mg/dL (ref 0.2–1.2)
BUN: 21 mg/dL (ref 7–25)
CALCIUM: 9.2 mg/dL (ref 8.6–10.4)
CO2: 29 mmol/L (ref 20–31)
Chloride: 102 mmol/L (ref 98–110)
Creat: 0.79 mg/dL (ref 0.60–0.93)
GLUCOSE: 99 mg/dL (ref 65–99)
POTASSIUM: 4.5 mmol/L (ref 3.5–5.3)
Sodium: 141 mmol/L (ref 135–146)
Total Protein: 6.2 g/dL (ref 6.1–8.1)

## 2016-03-31 NOTE — Progress Notes (Signed)
Patient ID: [name] Diaz, female   DOB: 1934-07-11, 82 y.o.   MRN: [id number]     Cardiology Office Note   Date:  04/01/2016   ID:  [name] Diaz, DOB 1934/05/14, MRN [id number]  PCP:  Charolett Bumpers, MD    No chief complaint on file. f/u CAD   Wt Readings from Last 3 Encounters:  04/01/16 160 lb 12.8 oz (72.938 kg)  06/20/15 159 lb (72.122 kg)  03/30/15 156 lb (70.761 kg)       History of Present Illness: [name] Diaz is a 82 y.o. female  who has had AFib and MI. She has had a stroke secondary to atrial fibrillation in the past. She had an LAD stent 2011.  She has several rashes which may be related to Provera and Xarelto. THis has improved. She follows with dermatology. Currently she feels well. No chest pain. Minimal shortness of breath but this does not limit her. No palpitations. No significant leg swelling on Lasix.   She had coils placed in veins in her vulva due to bleeding in Jan 2016.  THis has improved but her legs feels worse.  She still has occasional swelling and spider veins.  Worse by midday.    She is not going to go back to [facility] where the initial procedure was done.     No chest pain like the heart attack pain she had in the past.  She does not even think of using NTG when this happen.  It was not related to exertion.  Execise has been limited due to the GYN bleeding.  She can restart exercise at this time. She walks up to twice a day.  SHe wants to stay out of the heat.     Past Medical History  Diagnosis Date  . Myocardial infarction [facility]) 2011    DES TO LAD  . Dysrhythmia     Atrial fib  . Shortness of breath     on exertion  . A-fib (HCC)   . History of colon polyps   . Colonic diverticular disease   . History of cataract surgery     with lens implant     Past Surgical History  Procedure Laterality Date  . Vein ligation  1960s  . Hysteroscopy w/d&c  08/25/2012    Procedure: DILATATION AND CURETTAGE /HYSTEROSCOPY;  Surgeon: Geryl Rankins,  MD;  Location: [facility];  Service: Gynecology;  Laterality: N/A;  TruClear  . Eye surgery      Bilateral Cataract surgery w/ lens implant       Current Outpatient Prescriptions  Medication Sig Dispense Refill  . Calcium Carbonate-Vitamin D (CALTRATE 600+D) 600-400 MG-UNIT per tablet Take 1 tablet by mouth daily.     . diazepam (VALIUM) 5 MG tablet Take 5 mg by mouth at bedtime as needed. For anxiety    . fish oil-omega-3 fatty acids 1000 MG capsule Take 3 g by mouth daily.    . furosemide (LASIX) 20 MG tablet TAKE TWO TABLETS (40 MG ) BY MOUTH ONCE DAILY AS DIRECTED. 60 tablet 6  . hydrocortisone cream 1 % Apply 1 application topically 2 (two) times daily as needed. For insect bites    . metoprolol (LOPRESSOR) 100 MG tablet TAKE 1 TABLET BY MOUTH TWICE A DAY 60 tablet 0  . Multiple Vitamin (MULTIVITAMIN WITH MINERALS) TABS Take 1 tablet by mouth daily.    . nitroGLYCERIN (NITROSTAT) 0.4 MG SL tablet Place 1 tablet (0.4 mg total) under the  tongue every 5 (five) minutes as needed for chest pain. 25 tablet 5  . nitroGLYCERIN (NITROSTAT) 0.4 MG SL tablet Place 0.4 mg under the tongue every 5 (five) minutes as needed for chest pain (X 3 DOSES FOR CHEST PAIN).    Marland Kitchen. simvastatin (ZOCOR) 20 MG tablet TAKE 1 TABLET BY MOUTH AT BEDTIME 30 tablet 7  . XARELTO 20 MG TABS tablet TAKE 1 TABLET (20 MG TOTAL) BY MOUTH DAILY WITH SUPPER. 30 tablet 0   No current facility-administered medications for this visit.    Allergies:   Clindamycin/lincomycin    Social History:  The patient  reports that she has quit smoking. She does not have any smokeless tobacco history on file. She reports that she does not drink alcohol or use illicit drugs.   Family History:  The patient's family history includes CVA in her mother; Heart attack in her brother.    ROS:  Please see the history of present illness.   Otherwise, review of systems are positive for DOE, stable. No bleeding problems.   All other systems are reviewed  and negative.    PHYSICAL EXAM: VS:  BP 90/60 mmHg  Pulse 70  Ht 5' 7.5" (1.715 m)  Wt 160 lb 12.8 oz (72.938 kg)  BMI 24.80 kg/m2 , BMI Body mass index is 24.8 kg/(m^2). GEN: Well nourished, well developed, in no acute distress HEENT: normal Neck: no JVD, carotid bruits, or masses Cardiac: irregularly irregular; no murmurs, rubs, or gallops,tr edema  Respiratory:  clear to auscultation bilaterally, normal work of breathing GI: soft, nontender, nondistended, + BS MS: no deformity or atrophy Skin: warm and dry, no rash, spider veins, prominent varicose veins bilaterally Neuro:  Strength and sensation are intact Psych: euthymic mood, full affect   EKG:   The ekg ordered today demonstrates AFib, controlled rate, no ST segment changes   Recent Labs: 03/25/2016: ALT 25; BUN 21; Creat 0.79; Hemoglobin 13.0; Platelets 162; Potassium 4.5; Sodium 141   Lipid Panel    Component Value Date/Time   CHOL 138 03/25/2016 0842   TRIG 52 03/25/2016 0842   HDL 80 03/25/2016 0842   CHOLHDL 1.7 03/25/2016 0842   VLDL 10 03/25/2016 0842   LDLCALC 48 03/25/2016 0842     Other studies Reviewed: Additional studies/ records that were reviewed today with results demonstrating: cath report reviewed .   ASSESSMENT AND PLAN:   Atrial fibrillation  Continue Metoprolol Tartrate Tablet, 100 MG, TAKE 1 TABLET BY MOUTH TWICE A DAY Notes: Rate controlled. Xarelto for stroke prevention.  Rare nosebleeds- in the winter. WIll continue Xarelto for now. No palpitations.  Reduced by metoprolol.  SHe knows when she misses.   2. Old myocardial infarction  Continue Simvastatin Tablet, 20 MG, TAKE 1 TABLET AT BEDTIME Continue Nitroglycerin 0.4 mg tablet, 0.4 mg, 1 tablet as directed, SL, as directed prn chest pain. Take every 5 minutes, up to 3 doses, if pain does not go away call 911 or go to ER. Notes: No CHF. BMS in the LAD, vision 2.75 x 18, postdilated to 3.3 mm. No CHF or angina.   3. Chronic  diastolic heart failure  Increase Lasix Tablet, 20 MG, 2 tablet, Orally, Once a day Notes: Likely caused by age and AFib. Now taking Lasix daily. She notices a difference if she skips a day. LE edema: likely related to venous insufficiency. LE edema better with the higher dose  Of Lasix. She adjusts the dose of the Lasix as needed as well.  4. Venous insufficiency: COntinue to elevate her legs.  SHe will look into more compression stockings that are more comfortable.  She had a problem treated by an interventional radiologist at [facility].  If she has worsening swelling, we can refer to Dr. Arbie Cookey.  She was seen most recently at [facility].  She feels that a lot of this got worse after she stopped her hormone replacement therapy.   Current medicines are reviewed at length with the patient today.  The patient concerns regarding her medicines were addressed.  The following changes have been made:  No change  Labs/ tests ordered today include:   Orders Placed This Encounter  Procedures  . EKG 12-Lead    Recommend 150 minutes/week of aerobic exercise Low fat, low carb, high fiber diet recommended  Disposition:   FU in 1 year   Signed, Lance Muss, MD  04/01/2016 1:49 PM    [facility] [address] [place], [place], [place]  [address] Phone: [phone]; Fax: [phone]

## 2016-04-01 ENCOUNTER — Ambulatory Visit (INDEPENDENT_AMBULATORY_CARE_PROVIDER_SITE_OTHER): Payer: Medicare Other | Admitting: Interventional Cardiology

## 2016-04-01 ENCOUNTER — Encounter: Payer: Self-pay | Admitting: Interventional Cardiology

## 2016-04-01 VITALS — BP 90/60 | HR 70 | Ht 67.5 in | Wt 160.8 lb

## 2016-04-01 DIAGNOSIS — I872 Venous insufficiency (chronic) (peripheral): Secondary | ICD-10-CM | POA: Diagnosis not present

## 2016-04-01 DIAGNOSIS — I252 Old myocardial infarction: Secondary | ICD-10-CM

## 2016-04-01 DIAGNOSIS — I482 Chronic atrial fibrillation, unspecified: Secondary | ICD-10-CM

## 2016-04-01 DIAGNOSIS — I5032 Chronic diastolic (congestive) heart failure: Secondary | ICD-10-CM

## 2016-04-01 NOTE — Patient Instructions (Signed)
**Note De-identified  Obfuscation** Medication Instructions:  Same-no changes  Labwork: None  Testing/Procedures: None  Follow-Up: Your physician wants you to follow-up in: 1 year. You will receive a reminder letter in the mail two months in advance. If you don't receive a letter, please call our office to schedule the follow-up appointment.      If you need a refill on your cardiac medications before your next appointment, please call your pharmacy.   

## 2016-04-09 ENCOUNTER — Other Ambulatory Visit: Payer: Self-pay | Admitting: Interventional Cardiology

## 2016-04-24 ENCOUNTER — Other Ambulatory Visit: Payer: Self-pay | Admitting: Interventional Cardiology

## 2016-05-06 ENCOUNTER — Other Ambulatory Visit: Payer: Self-pay | Admitting: *Deleted

## 2016-05-06 MED ORDER — METOPROLOL TARTRATE 100 MG PO TABS
100.0000 mg | ORAL_TABLET | Freq: Two times a day (BID) | ORAL | Status: DC
Start: 2016-05-06 — End: 2017-01-22

## 2016-07-08 IMAGING — US US EXTREM LOW VENOUS*L*
1 series · 13 of 16 positions shown · non-contrast
Comparison: None.

CLINICAL DATA: long history of lower extremity varicose veins. She
had bilateral GSD vein stripping back in 8966 due to severe
bilateral pain, which relieved the symptoms at that time. She then
developed bleeding vulvar veins, and was seen by IR in
[HOSPITAL], and underwent left ovarian vein embolization for
pelvic congestion syndrome and reflux in October 2014. She has had
no further problems with vulvar varicosities. SHe states that her
lower extremity pain reoccurred shortly after this procedure. She
complains of bilateral lower extremity edema, pain, tiredness, skin
color changes, spider and varicose veins. She's tried knee-high
compression hose but these bind her about the calf, and she does not
use them now. No history of venous ulceration, DVT, or PE. There is
a history of varicose/spider veins in her mother. She is G2 P2. She
is a retired [employer] teacher, and at work would
stand more than 8 hours per day. She does not use any pain
medications for the varicose/spider veins. She does feel that
elevation helps significantly. Good relief from ambulatory
phlebectomy, right lower extremity, 09/20/2015, with follow-up
sclerotherapy. Partial symptomatic relief from left lower extremity
ambulatory phlebectomy, with some residual medial thigh and lateral
calf varicose symptomatic varicose veins. She underwent follow-up
foam sclerotherapy of residual symptomatic left lower extremity
varicose veins 01/17/2016.

She used her a compression hose for about 5 weeks. She is not having
any leg pain. She has persistent visible left varicose veins.
EXAM:
LEFT LOWER EXTREMITY VENOUS DUPLEX ULTRASOUND
TECHNIQUE: Gray-scale sonography with graded compression, as well as color
Doppler and duplex ultrasound, were performed to evaluate the deep
and superficial veins of left lower extremity. Spectral Doppler was
utilized to evaluate flow at rest and with distal augmentation
maneuvers. A complete superficial venous insufficiency exam was
performed in the upright standing position. I personally performed
the technical portion of the exam.

[Series 1: us extrem low venous*left* · 13 of 16 slices shown]
[im 1/16]
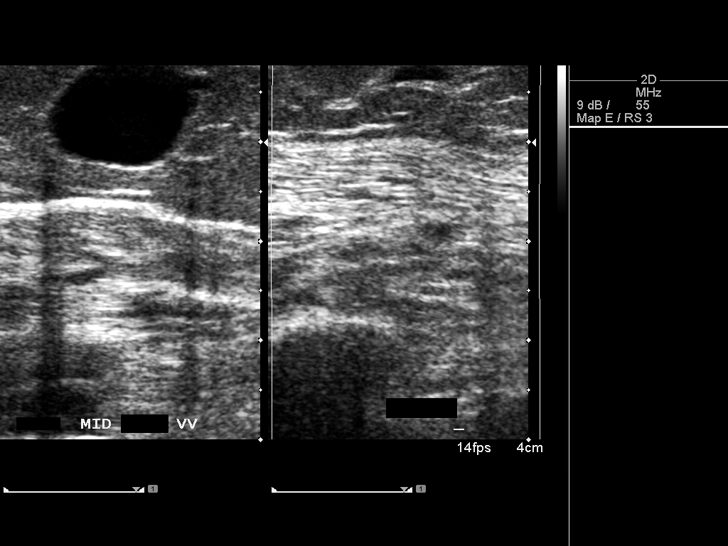
[im 2/16]
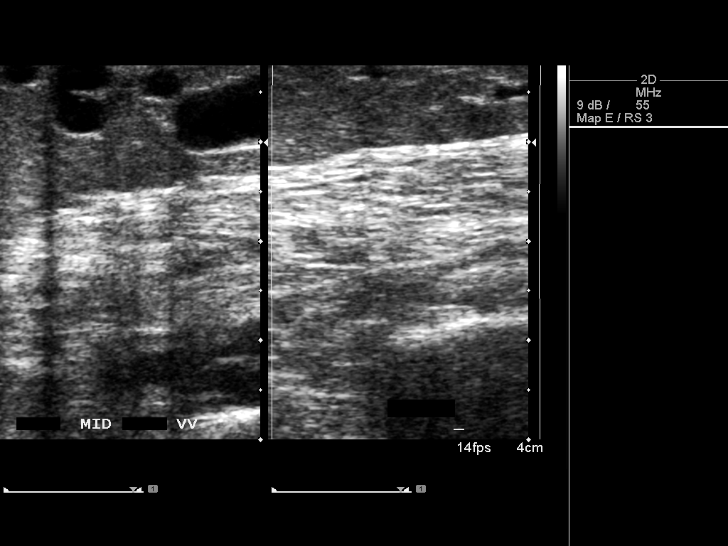
[im 4/16]
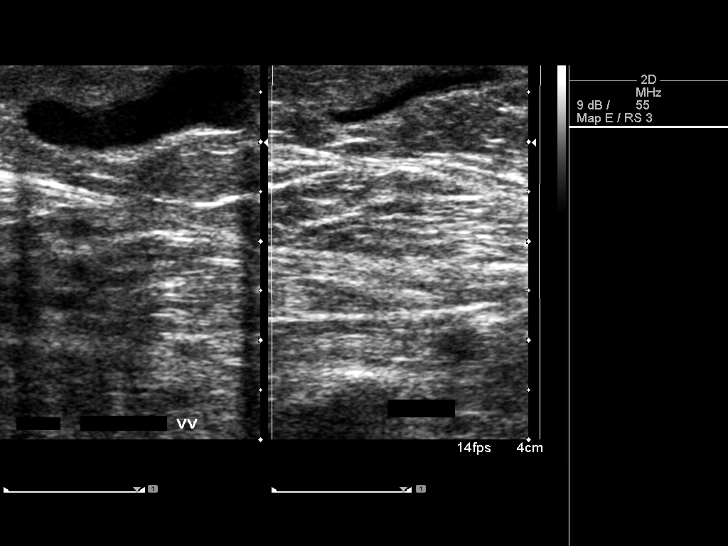
[im 5/16]
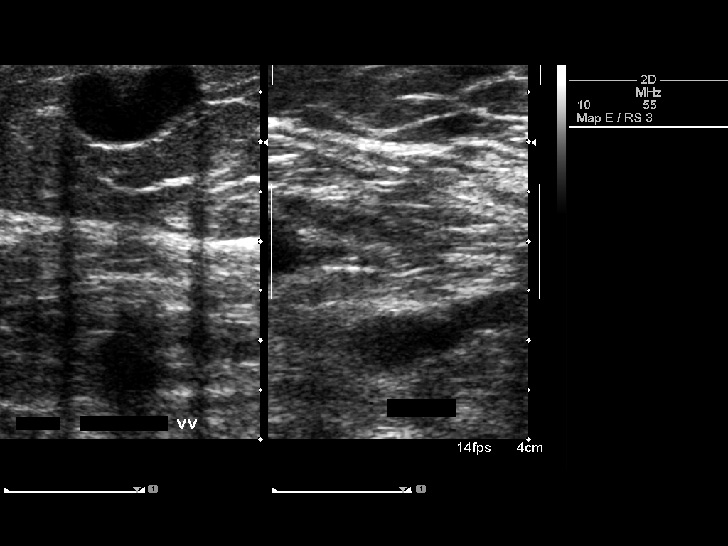
[im 6/16]
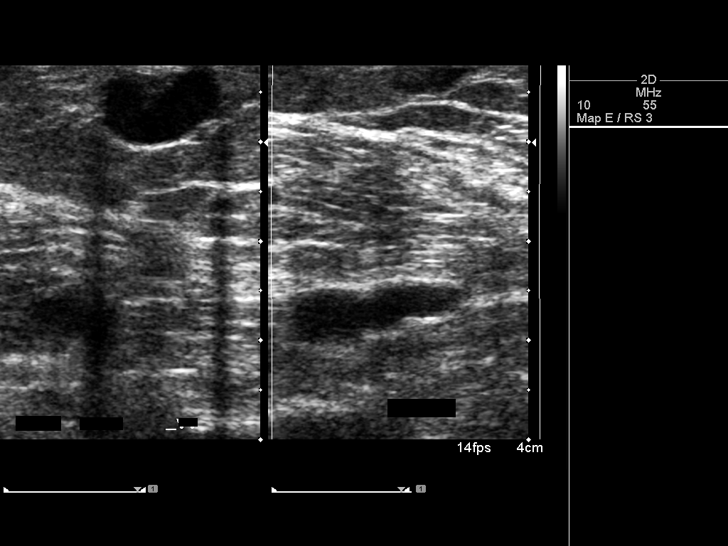
[im 7/16]
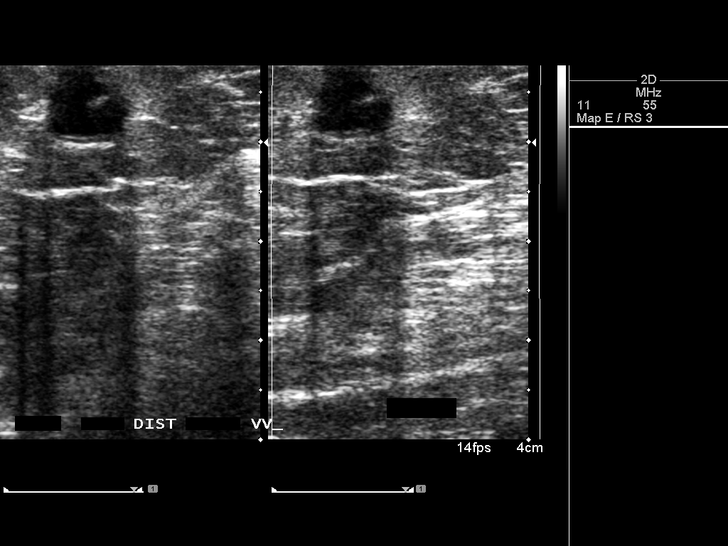
[im 9/16]
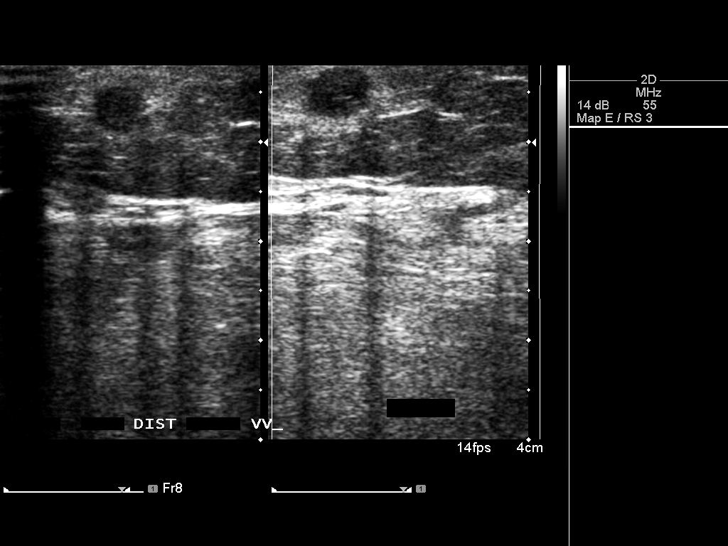
[im 10/16]
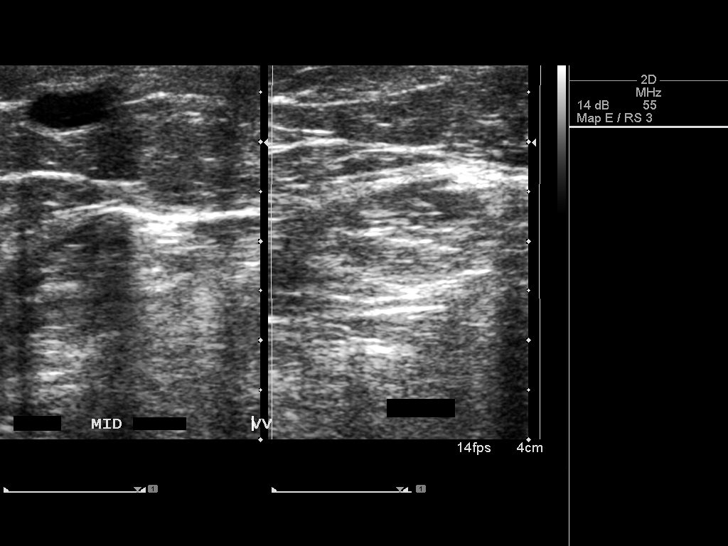
[im 11/16]
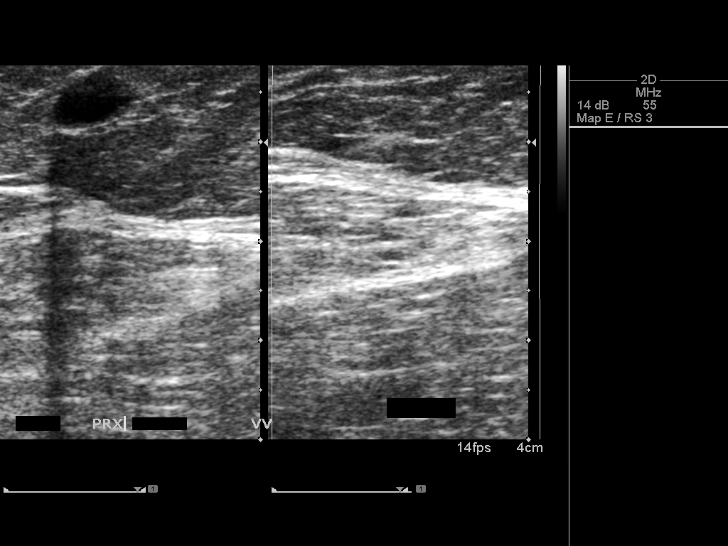
[im 12/16]
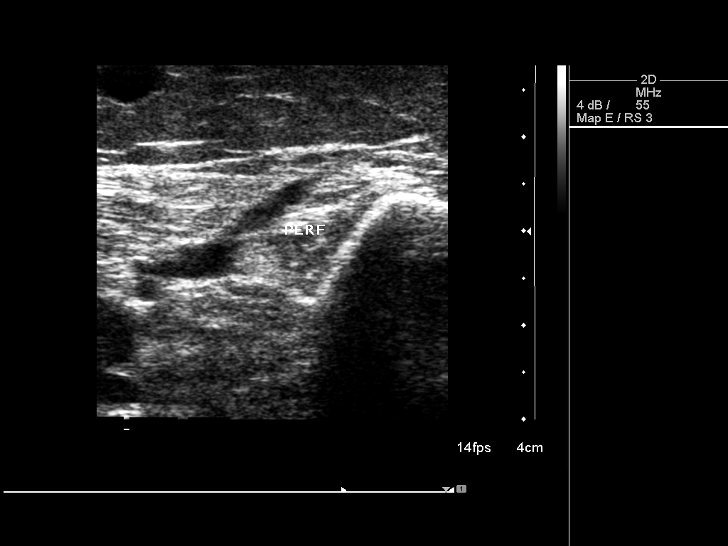
[im 13/16]
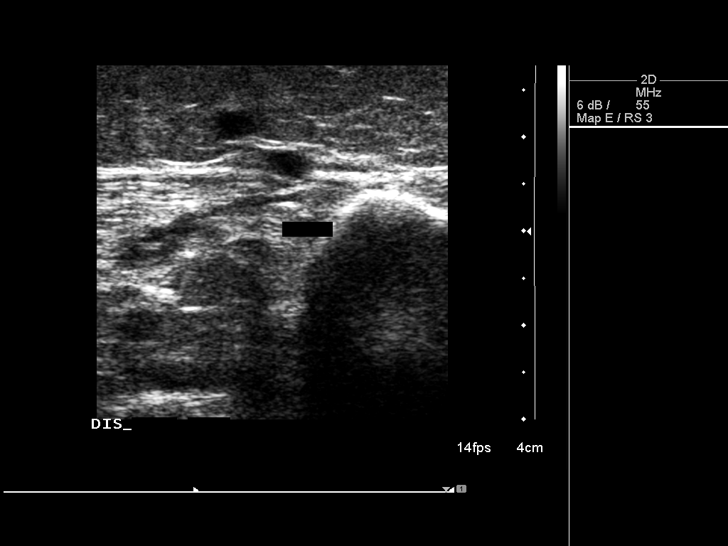
[im 15/16]
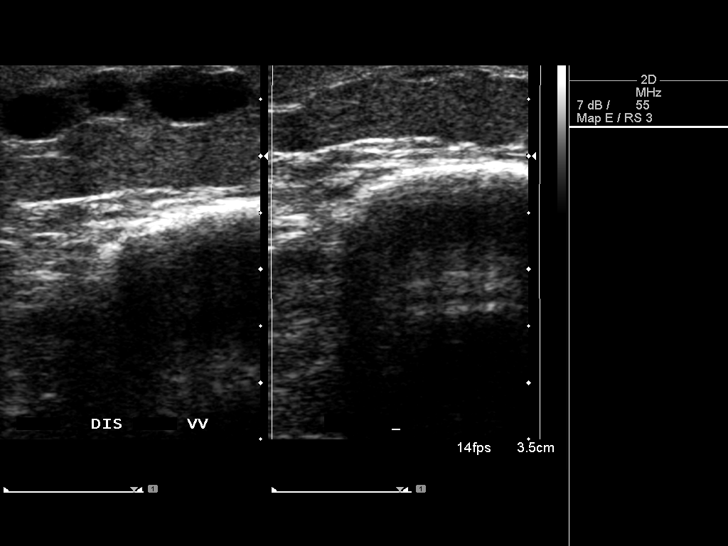
[im 16/16]
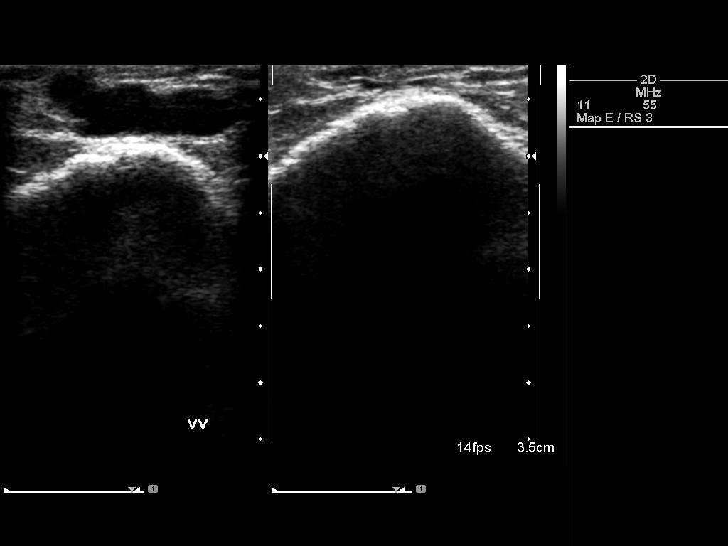

[13 of 16 positions shown; findings below may reference images not displayed]

FINDINGS: Deep Venous System:

Evaluation of the deep venous system including the common femoral,
femoral, profunda femoral, popliteal and calf veins (where visible)
demonstrate no evidence of deep venous thrombosis. The vessels are
compressible and demonstrate normal respiratory phasicity and
response to augmentation. No evidence of the deep venous reflux.

Superficial Venous System:

Large compressible patent varicose veins extend from the distal
thigh to the mid calf. Partially occluded thrombosed varicose veins
in the posterior distal thigh, region of foam sclerotherapy.
IMPRESSION: 1. Incomplete thrombosis of extensive large thigh and calf varicose
veins post foam injection sclerotherapy.
2. Negative for left lower extremity DVT.

## 2016-07-24 ENCOUNTER — Other Ambulatory Visit: Payer: Self-pay | Admitting: Obstetrics and Gynecology

## 2016-07-24 DIAGNOSIS — Z1231 Encounter for screening mammogram for malignant neoplasm of breast: Secondary | ICD-10-CM

## 2016-07-26 ENCOUNTER — Ambulatory Visit
Admission: RE | Admit: 2016-07-26 | Discharge: 2016-07-26 | Disposition: A | Discharge status: Home / Self Care | Payer: Medicare Other | Source: Ambulatory Visit | Attending: Obstetrics and Gynecology | Admitting: Obstetrics and Gynecology

## 2016-07-26 DIAGNOSIS — Z1231 Encounter for screening mammogram for malignant neoplasm of breast: Secondary | ICD-10-CM

## 2016-09-24 ENCOUNTER — Other Ambulatory Visit: Payer: Self-pay | Admitting: Nurse Practitioner

## 2017-01-22 ENCOUNTER — Other Ambulatory Visit: Payer: Self-pay | Admitting: Interventional Cardiology

## 2017-04-01 ENCOUNTER — Ambulatory Visit (INDEPENDENT_AMBULATORY_CARE_PROVIDER_SITE_OTHER): Payer: Medicare Other | Admitting: Interventional Cardiology

## 2017-04-01 ENCOUNTER — Encounter: Payer: Self-pay | Admitting: Interventional Cardiology

## 2017-04-01 VITALS — BP 118/70 | HR 68 | Ht 67.0 in | Wt 162.4 lb

## 2017-04-01 DIAGNOSIS — E785 Hyperlipidemia, unspecified: Secondary | ICD-10-CM | POA: Diagnosis not present

## 2017-04-01 DIAGNOSIS — I252 Old myocardial infarction: Secondary | ICD-10-CM

## 2017-04-01 DIAGNOSIS — I482 Chronic atrial fibrillation, unspecified: Secondary | ICD-10-CM

## 2017-04-01 DIAGNOSIS — Z7901 Long term (current) use of anticoagulants: Secondary | ICD-10-CM

## 2017-04-01 DIAGNOSIS — I5032 Chronic diastolic (congestive) heart failure: Secondary | ICD-10-CM

## 2017-04-01 MED ORDER — NITROGLYCERIN 0.4 MG SL SUBL: 0.4000 mg | SUBLINGUAL_TABLET | SUBLINGUAL | 5 refills | Status: DC | PRN

## 2017-04-01 NOTE — Progress Notes (Signed)
Cardiology Office Note   Date:  04/01/2017   ID:  [name] CovertGail B Cassarino, DOB [birth year], MRN [id number]  PCP:  Charolett BumpersJohnson, Martin K, MD    No chief complaint on file. AFib   Wt Readings from Last 3 Encounters:  04/01/17 162 lb 6.4 oz (73.7 kg)  04/01/16 160 lb 12.8 oz (72.9 kg)  06/20/15 159 lb (72.1 kg)       History of Present Illness: [name] Diaz is a 83 y.o. female  who has had AFib and MI. She has had a stroke secondary to atrial fibrillation in the past. She had a DES to LAD in 2011.   MI was thought to be due to a possible left atrial appendage embolism down the coronary.   In the past,  She has several rashes which may be related to Provera and Xarelto. THis has improved. She follows with dermatology.  She had coils placed in veins in her vulva due to bleeding in Jan 2016.  [facility].  She has had some stress from an adult child.  SHe has had some mild depression which she is dealing with and is improving.  She has not been walking regularly.    She plans on getting that back in her routine.  She has not been walking with her dog as the dog gets older.   Denies : Chest pain. Dizziness. Leg edema. Nitroglycerin use. Orthopnea. Paroxysmal nocturnal dyspnea. Shortness of breath. Syncope.   She continue to have intermittent rashes. She feels like insect bites last for a long time. She has sensitive skin. Reports rare palpitations.  Easy bruises, even before Xarelto.  No internal bleeding.  No blood in stool.      Past Medical History:  Diagnosis Date  . A-fib (HCC)   . Colonic diverticular disease   . Dysrhythmia    Atrial fib  . History of cataract surgery    with lens implant   . History of colon polyps   . Myocardial infarction [facility](HCC) 2011   DES TO LAD  . Shortness of breath    on exertion    Past Surgical History:  Procedure Laterality Date  . EYE SURGERY     Bilateral Cataract surgery w/ lens implant    . HYSTEROSCOPY W/D&C  08/25/2012   Procedure: DILATATION AND CURETTAGE /HYSTEROSCOPY;  Surgeon: Geryl RankinsEvelyn Varnado, MD;  Location: [facility];  Service: Gynecology;  Laterality: N/A;  [employer]  . VEIN LIGATION  1960s     Current Outpatient Prescriptions  Medication Sig Dispense Refill  . Calcium Carbonate-Vitamin D (CALTRATE 600+D) 600-400 MG-UNIT per tablet Take 1 tablet by mouth daily.     . diazepam (VALIUM) 5 MG tablet Take 5 mg by mouth at bedtime as needed. For anxiety    . fish oil-omega-3 fatty acids 1000 MG capsule Take 3 g by mouth daily.    . furosemide (LASIX) 20 MG tablet TAKE TWO TABLETS (40 MG ) BY MOUTH ONCE DAILY AS DIRECTED. 60 tablet 11  . hydrocortisone cream 1 % Apply 1 application topically 2 (two) times daily as needed. For insect bites    . metoprolol (LOPRESSOR) 100 MG tablet TAKE 1 TABLET (100 MG TOTAL) BY MOUTH 2 (TWO) TIMES DAILY. 180 tablet 0  . Multiple Vitamin (MULTIVITAMIN WITH MINERALS) TABS Take 1 tablet by mouth daily.    . nitroGLYCERIN (NITROSTAT) 0.4 MG SL tablet Place 1 tablet (0.4 mg total) under the tongue every 5 (five) minutes as  needed for chest pain. 25 tablet 5  . simvastatin (ZOCOR) 20 MG tablet TAKE 1 TABLET BY MOUTH AT BEDTIME 30 tablet 6  . XARELTO 20 MG TABS tablet TAKE 1 TABLET (20 MG TOTAL) BY MOUTH DAILY WITH SUPPER. 30 tablet 11   No current facility-administered medications for this visit.     Allergies:   Clindamycin/lincomycin    Social History:  The patient  reports that she has quit smoking. Her smoking use included Cigarettes. She quit after 40.00 years of use. She has never used smokeless tobacco. She reports that she does not drink alcohol or use drugs.   Family History:  The patient's family history includes CVA in her mother; Heart attack in her brother.    ROS:  Please see the history of present illness.   Otherwise, review of systems are positive for easy bruising.   All other systems are reviewed and negative.    PHYSICAL EXAM: VS:  BP 118/70   Pulse 68    Ht 5\' 7"  (1.702 m)   Wt 162 lb 6.4 oz (73.7 kg)   SpO2 98%   BMI 25.44 kg/m  , BMI Body mass index is 25.44 kg/m. GEN: Well nourished, well developed, in no acute distress  HEENT: normal  Neck: no JVD, carotid bruits, or masses Cardiac: irregularly irregular; no murmurs, rubs, or gallops,no edema  Respiratory:  clear to auscultation bilaterally, normal work of breathing GI: soft, nontender, nondistended, + BS MS: no deformity or atrophy  Skin: warm and dry, no rash Neuro:  Strength and sensation are intact Psych: euthymic mood, full affect   EKG:   The ekg ordered today demonstrates AFib, rate controlled   Recent Labs: No results found for requested labs within last 8760 hours.   Lipid Panel    Component Value Date/Time   CHOL 138 03/25/2016 0842   TRIG 52 03/25/2016 0842   HDL 80 03/25/2016 0842   CHOLHDL 1.7 03/25/2016 0842   VLDL 10 03/25/2016 0842   LDLCALC 48 03/25/2016 0842     Other studies Reviewed: Additional studies/ records that were reviewed today with results demonstrating: Normal LV function by echo.   ASSESSMENT AND PLAN:  1. Atrial fibrillation: Rate controlled.  Continue current rate control meds.  Xarelto for stroke prevention.  Tolerating this well.  Check labs in a few days when fasting. 2. Chronic diastolic heart failure: She appears euvolemic.  Tolerating Lasix 40 mg daily. 3. Old MI: No CHF sx. LDL target < 70.  4. Venous insufficiency: Her leg pain is better.  No further procedures planned.  Continue leg elevation and compression stockings.   Current medicines are reviewed at length with the patient today.  The patient concerns regarding her medicines were addressed.  The following changes have been made:  No change  Labs/ tests ordered today include:  No orders of the defined types were placed in this encounter.   Recommend 150 minutes/week of aerobic exercise Low fat, low carb, high fiber diet recommended  Disposition:   FU in 1  year   Signed, Lance Muss, MD  04/01/2017 2:30 PM    [facility] [address] [place], [place], [place]  [address] Phone: [phone]; Fax: [phone]

## 2017-04-01 NOTE — Patient Instructions (Signed)
Medication Instructions:  Your physician recommends that you continue on your current medications as directed. Please refer to the Current Medication list given to you today.   Labwork: Your physician recommends that you return for lab work for FASTING LIPIDS, CMET, and CBC   Testing/Procedures: None ordered  Follow-Up: Your physician wants you to follow-up in: 1 year with Dr. Eldridge DaceVaranasi. You will receive a reminder letter in the mail two months in advance. If you don't receive a letter, please call our office to schedule the follow-up appointment.   Any Other Special Instructions Will Be Listed Below (If Applicable).     If you need a refill on your cardiac medications before your next appointment, please call your pharmacy.

## 2017-04-01 NOTE — Addendum Note (Signed)
Addended by: Daleen BoURRIE, BRITTANY I on: 04/01/2017 03:34 PM   Modules accepted: Orders

## 2017-04-07 ENCOUNTER — Other Ambulatory Visit: Payer: Medicare Other | Admitting: *Deleted

## 2017-04-07 DIAGNOSIS — E785 Hyperlipidemia, unspecified: Secondary | ICD-10-CM

## 2017-04-07 DIAGNOSIS — I482 Chronic atrial fibrillation, unspecified: Secondary | ICD-10-CM

## 2017-04-08 LAB — CBC
Hematocrit: 40.1 % (ref 34.0–46.6)
Hemoglobin: 13.1 g/dL (ref 11.1–15.9)
MCH: 30.1 pg (ref 26.6–33.0)
MCHC: 32.7 g/dL (ref 31.5–35.7)
MCV: 92 fL (ref 79–97)
PLATELETS: 183 10*3/uL (ref 150–379)
RBC: 4.35 x10E6/uL (ref 3.77–5.28)
RDW: 13.6 % (ref 12.3–15.4)
WBC: 14 10*3/uL — ABNORMAL HIGH (ref 3.4–10.8)

## 2017-04-08 LAB — COMPREHENSIVE METABOLIC PANEL
ALK PHOS: 85 IU/L (ref 39–117)
ALT: 15 IU/L (ref 0–32)
AST: 25 IU/L (ref 0–40)
Albumin/Globulin Ratio: 2.2 (ref 1.2–2.2)
Albumin: 4.4 g/dL (ref 3.5–4.7)
BILIRUBIN TOTAL: 0.5 mg/dL (ref 0.0–1.2)
BUN / CREAT RATIO: 25 (ref 12–28)
BUN: 21 mg/dL (ref 8–27)
CHLORIDE: 98 mmol/L (ref 96–106)
CO2: 26 mmol/L (ref 20–29)
Calcium: 9.4 mg/dL (ref 8.7–10.3)
Creatinine, Ser: 0.84 mg/dL (ref 0.57–1.00)
GFR calc non Af Amer: 66 mL/min/{1.73_m2} (ref >59)
GFR, EST AFRICAN AMERICAN: 76 mL/min/{1.73_m2} (ref >59)
Globulin, Total: 2 g/dL (ref 1.5–4.5)
Glucose: 98 mg/dL (ref 65–99)
POTASSIUM: 4 mmol/L (ref 3.5–5.2)
Sodium: 141 mmol/L (ref 134–144)
TOTAL PROTEIN: 6.4 g/dL (ref 6.0–8.5)

## 2017-04-08 LAB — LIPID PANEL
Chol/HDL Ratio: 2 ratio (ref 0.0–4.4)
Cholesterol, Total: 136 mg/dL (ref 100–199)
HDL: 69 mg/dL (ref >39)
LDL Calculated: 53 mg/dL (ref 0–99)
Triglycerides: 68 mg/dL (ref 0–149)
VLDL Cholesterol Cal: 14 mg/dL (ref 5–40)

## 2017-04-08 LAB — TSH: TSH: 4.1 u[IU]/mL (ref 0.450–4.500)

## 2017-04-14 ENCOUNTER — Other Ambulatory Visit: Payer: Self-pay | Admitting: Interventional Cardiology

## 2017-04-15 ENCOUNTER — Telehealth: Payer: Self-pay | Admitting: Interventional Cardiology

## 2017-04-15 NOTE — Telephone Encounter (Signed)
New message     Pt has questions on her blood work, she does not understand the numbers on my chart

## 2017-04-15 NOTE — Telephone Encounter (Signed)
Patient called to go over labs.  Results reviewed with patient. Labs forwarded to PCP. Patient verbalizes understanding and appreciated the call.

## 2017-04-15 NOTE — Telephone Encounter (Signed)
Notes recorded by Corky CraftsVaranasi, Jayadeep S, MD on 04/08/2017 at 1:21 PM EDT Chronically elevated WBC count . Everything else normal. Please send a copy to PMD as well.

## 2017-04-15 NOTE — Telephone Encounter (Signed)
-----   Message from Corky CraftsJayadeep S Varanasi, MD sent at 04/07/2017  4:07 PM EDT ----- Lipids, liver , electrolytes normal.  CBC pending.  Please mail the patient a hard copy of her results.

## 2017-04-21 ENCOUNTER — Other Ambulatory Visit: Payer: Self-pay | Admitting: Interventional Cardiology

## 2017-04-21 NOTE — Telephone Encounter (Signed)
Request received for Xarelto 20mg ; pt is [age]yrs, wt-73.7kg, Crea-0.84 on 03/2017, CrCl-62.6415ml/min & last seen by Dr. Eldridge DaceVaranasi on 04/01/17. Will send in refill to requested Pharmacy.

## 2017-05-06 ENCOUNTER — Other Ambulatory Visit: Payer: Self-pay | Admitting: Nurse Practitioner

## 2017-06-26 ENCOUNTER — Other Ambulatory Visit: Payer: Self-pay | Admitting: Obstetrics and Gynecology

## 2017-06-26 DIAGNOSIS — Z1231 Encounter for screening mammogram for malignant neoplasm of breast: Secondary | ICD-10-CM

## 2017-06-30 ENCOUNTER — Telehealth: Payer: Self-pay | Admitting: Interventional Cardiology

## 2017-06-30 NOTE — Telephone Encounter (Signed)
[name] Diaz is calling because she has a question about the medication (Simvastatin) . Please call

## 2017-06-30 NOTE — Telephone Encounter (Signed)
Patient made aware and verbalizes understanding.

## 2017-06-30 NOTE — Telephone Encounter (Signed)
Patient calling and states that she has had a rash on her face and neck for the past 1.5 months. She states that she has seen dermatology in the past for this. Patient states that the manufacturer for her simvastatin changed about a month ago and she wanted to know if she could try and stop the simvastatin to see if it would help. Patient advised to see dermatology. Will forward to Dr. Eldridge DaceVaranasi for review and recommendation.

## 2017-06-30 NOTE — Telephone Encounter (Signed)
OK to hold simvastatin.  We can change to a different statin if rash improves.

## 2017-07-28 ENCOUNTER — Ambulatory Visit
Admission: RE | Admit: 2017-07-28 | Discharge: 2017-07-28 | Disposition: A | Discharge status: Home / Self Care | Payer: Medicare Other | Source: Ambulatory Visit | Attending: Obstetrics and Gynecology | Admitting: Obstetrics and Gynecology

## 2017-07-28 ENCOUNTER — Telehealth: Payer: Self-pay | Admitting: Interventional Cardiology

## 2017-07-28 DIAGNOSIS — Z1231 Encounter for screening mammogram for malignant neoplasm of breast: Secondary | ICD-10-CM

## 2017-07-28 DIAGNOSIS — E785 Hyperlipidemia, unspecified: Secondary | ICD-10-CM

## 2017-07-28 NOTE — Telephone Encounter (Signed)
Called and spoke to patient who states that since restarting her simvastatin her rash has returned on her face and neck. Patient wanting to know if she can come off her statin all together. Patient's las LDL 3 months ago was 53. Made patient aware that given her history her LDL goal is less than 70. Made patient aware that I would discuss with Dr. Eldridge Dace. Made patient aware that Dr. Eldridge Dace was okay with her stopping her simvastatin and rechecking her labs in 3 months. LIPIDS ordered and appointment made for 10/27/2017. Patient verbalized understanding and thanked me for the call.

## 2017-07-28 NOTE — Telephone Encounter (Signed)
New Message     Pt c/o medication issue:  1. Name of Medication:  simbostatin  2. How are you currently taking this medication (dosage and times per day)? 1x  A day at bed time  3. Are you having a reaction (difficulty breathing--STAT)? yes  4. What is your medication issue? Red rash all over her face and throat area

## 2017-07-31 ENCOUNTER — Other Ambulatory Visit: Payer: Self-pay | Admitting: Obstetrics and Gynecology

## 2017-07-31 DIAGNOSIS — R928 Other abnormal and inconclusive findings on diagnostic imaging of breast: Secondary | ICD-10-CM

## 2017-08-02 ENCOUNTER — Other Ambulatory Visit: Payer: Self-pay | Admitting: Interventional Cardiology

## 2017-08-04 ENCOUNTER — Ambulatory Visit: Payer: Medicare Other

## 2017-08-04 ENCOUNTER — Ambulatory Visit
Admission: RE | Admit: 2017-08-04 | Discharge: 2017-08-04 | Disposition: A | Discharge status: Home / Self Care | Payer: Medicare Other | Source: Ambulatory Visit | Attending: Obstetrics and Gynecology | Admitting: Obstetrics and Gynecology

## 2017-08-04 DIAGNOSIS — R928 Other abnormal and inconclusive findings on diagnostic imaging of breast: Secondary | ICD-10-CM

## 2017-10-27 ENCOUNTER — Other Ambulatory Visit: Payer: Medicare Other

## 2017-11-07 ENCOUNTER — Other Ambulatory Visit: Payer: Medicare Other | Admitting: *Deleted

## 2017-11-07 DIAGNOSIS — E785 Hyperlipidemia, unspecified: Secondary | ICD-10-CM

## 2017-11-07 LAB — LIPID PANEL
CHOLESTEROL TOTAL: 166 mg/dL (ref 100–199)
Chol/HDL Ratio: 2.2 ratio (ref 0.0–4.4)
HDL: 75 mg/dL (ref >39)
LDL Calculated: 76 mg/dL (ref 0–99)
TRIGLYCERIDES: 75 mg/dL (ref 0–149)
VLDL Cholesterol Cal: 15 mg/dL (ref 5–40)

## 2017-11-13 ENCOUNTER — Other Ambulatory Visit: Payer: Self-pay

## 2017-11-13 DIAGNOSIS — E785 Hyperlipidemia, unspecified: Secondary | ICD-10-CM

## 2017-11-13 MED ORDER — ATORVASTATIN CALCIUM 10 MG PO TABS: 10.0000 mg | ORAL_TABLET | Freq: Every day | ORAL | 11 refills | Status: DC

## 2017-12-09 ENCOUNTER — Telehealth: Payer: Self-pay | Admitting: Interventional Cardiology

## 2017-12-09 NOTE — Telephone Encounter (Signed)
Called and spoke to patient. Instructed patient to stop her atorvastatin for a few weeks to see if her symptoms improved. Made her aware that if her symptoms improved that since she had a similar skin issue on simvastatin in the past, and her LDL was 76 without any statin that per pharmacy she can try zetia 10 mg QD. Patient wants to stop the atorvastatin for a few weeks and call back to report her symptoms. Patient is interested in being seen in the Lipid Clinic but will decide on scheduling at that time.

## 2017-12-09 NOTE — Telephone Encounter (Signed)
Pt c/o medication issue:  1. Name of Medication:Atorvastatin   2. How are you currently taking this medication (dosage and times per day)?10 mg  1 tablet once a day   3. Are you having a reaction (difficulty breathing--STAT)? Eyes are puffy and swollen , blotchy  Skin,and redness in face   4. What is your medication issue? Reaction to medication

## 2017-12-09 NOTE — Telephone Encounter (Signed)
Discussed with Aundra MilletMegan, RPH. Since patient had similar skin issue on simvastatin in the past, the patient took a 3 month break. Her recheck LIPIDS showed LDL at 76 without any statin. Per Aundra MilletMegan, [facility] patient can try zetia 10 mg QD.

## 2017-12-09 NOTE — Telephone Encounter (Signed)
Would check with pharmacy if she would be a candidate for PCSK9 inhibitor? Or a different statin?

## 2017-12-09 NOTE — Telephone Encounter (Signed)
Pt started on Atorvastatin about 3 weeks ago and has had no issues until last night.  Pt states last night she noticed her face had red, blotchy spots on her face and neck.  Denies itching.  This morning woke up and eyes are swollen, face is puffy and blotches are worse.  Denies any breathing issues.  Pt denies any other medication changes or changes to facial beauty products.

## 2017-12-09 NOTE — Telephone Encounter (Signed)
It looks as though patient has taken simvastatin in the past too. If she did not have the same reaction with simvastatin, would be comfortable rechallenging with low dose Crestor 10mg  daily after she stops Lipitor and her symptoms have improved. If she had similar side effects with simvastatin, would not rechallenge with another statin and schedule pt in lipid clinic to discuss PCSK9i therapy. It looks as though she has had previous rashes, potentially from Provera and Xarelto. Would ensure that symptoms resolve after stopping Lipitor to better correlate her symptoms as well.

## 2018-02-11 ENCOUNTER — Telehealth: Payer: Self-pay | Admitting: Interventional Cardiology

## 2018-02-11 NOTE — Telephone Encounter (Signed)
Patient calling back to report that her symptoms have improved since stopping the atorvastatin on 2/19. Patient is requesting an appointment in the Lipid Clinic to discuss treatment options for her cholesterol since she has been intolerant to statins. Patient did not want to try the zetia at this time. She states that she wants to talk to the PharmD and Dr. Eldridge DaceVaranasi first. Patient was previously scheduled for LIPIDS and LFTS tomorrow when she originally switched from simvastatin to atorvastatin on 1/22. Patient has been off of the atorvastatin for 2 months and is requesting to keep her lab appointment tomorrow to see what her cholesterol is without taking anything. Patient was scheduled in the Lipid Clinic on 5/9 at 11:00 AM.

## 2018-02-11 NOTE — Telephone Encounter (Signed)
New message   Patient requesting to speak with nurse, declined to give details.

## 2018-02-11 NOTE — Telephone Encounter (Signed)
Left message for patient to call back  

## 2018-02-12 ENCOUNTER — Other Ambulatory Visit: Payer: Medicare Other | Admitting: *Deleted

## 2018-02-12 DIAGNOSIS — E785 Hyperlipidemia, unspecified: Secondary | ICD-10-CM

## 2018-02-12 LAB — LIPID PANEL
CHOLESTEROL TOTAL: 194 mg/dL (ref 100–199)
Chol/HDL Ratio: 2.2 ratio (ref 0.0–4.4)
HDL: 87 mg/dL (ref >39)
LDL CALC: 93 mg/dL (ref 0–99)
TRIGLYCERIDES: 69 mg/dL (ref 0–149)
VLDL CHOLESTEROL CAL: 14 mg/dL (ref 5–40)

## 2018-02-12 LAB — HEPATIC FUNCTION PANEL
ALT: 15 IU/L (ref 0–32)
AST: 22 IU/L (ref 0–40)
Albumin: 4.8 g/dL — ABNORMAL HIGH (ref 3.5–4.7)
Alkaline Phosphatase: 81 IU/L (ref 39–117)
BILIRUBIN TOTAL: 0.4 mg/dL (ref 0.0–1.2)
Bilirubin, Direct: 0.14 mg/dL (ref 0.00–0.40)
Total Protein: 6.7 g/dL (ref 6.0–8.5)

## 2018-02-22 ENCOUNTER — Other Ambulatory Visit: Payer: Self-pay | Admitting: Interventional Cardiology

## 2018-02-26 ENCOUNTER — Ambulatory Visit (INDEPENDENT_AMBULATORY_CARE_PROVIDER_SITE_OTHER): Payer: Medicare Other | Admitting: Pharmacist

## 2018-02-26 ENCOUNTER — Encounter: Payer: Self-pay | Admitting: Pharmacist

## 2018-02-26 DIAGNOSIS — E785 Hyperlipidemia, unspecified: Secondary | ICD-10-CM | POA: Diagnosis not present

## 2018-02-26 HISTORY — DX: Hyperlipidemia, unspecified: E78.5

## 2018-02-26 NOTE — Progress Notes (Signed)
Patient ID: [name] Diaz                 DOB: 08/04/1934                    MRN: [id number]     HPI: [name] Diaz is a 84 y.o. female patient of Dr. Eldridge Dace that presents today for lipid evaluation.  PMH includes Afib, MI and stroke related to afib. She has previously had facial swelling and blotchiness with simvastatin and atorvastatin.   She presents today to discuss cholesterol and treatment options. She states that she believes there is a mis understanding because she found that zetia includes a statin medication. We had a lengthy conversation about formulations of Zetia/vytorin/liptruzet/ezetimibe and what each component contains. She states that she had the reaction to simvastatin (when she changed to a different generic manufacturer). She states that she was peeling and "was scary to children" for 6 weeks after this reaction. She believes that the reaction was actually due to the "vehicles" (or excipient components). Her face started to look like it did with the previous reaction and she stopped the atorvastatin before it got to bad. She does not want to be put on another statin medication due to this, even though she is realistic that it could have been the excipient components.   She does not think she would qualify for patient assistance for PCSK9i therapy due to income and she would like to avoid injections if possible. She does believe that a large part of her LDL is related to poor dietary choices.   Risk Factors: CAD s/p DES to LAD LDL Goal: <70  Current Medications: Fish oil 3 g daily Intolerances: atorvastatin  daily, simvastatin  daily (face swelling, blotchy skin and redness - which both resolved after discontinuation of therapy)  Diet: Most meals from home. She eats mostly chicken and some beef. Most meals are baked. She uses oil mostly. She does have a salad most days, but does not like most other greens. She does eat fruit regularly. She drinks mostly water.    Exercise: She walks and is doing more now. She has been trying to exercise at home as well.   Family History: CVA in her mother; Heart attack in her brother.   Social History: The patient  reports that she has quit smoking. Her smoking use included Cigarettes. She quit after 40.00 years of use. She has never used smokeless tobacco. She reports that she does not drink alcohol or use drugs.  Labs: 02/12/18: TC 194, TG 69, HDL 87, LDL 93, nonHDL 107 (fish oil 3 g daily)  Past Medical History:  Diagnosis Date  . A-fib (HCC)   . Colonic diverticular disease   . Dysrhythmia    Atrial fib  . History of cataract surgery    with lens implant   . History of colon polyps   . Myocardial infarction [facility]) 2011   DES TO LAD  . Shortness of breath    on exertion    Current Outpatient Medications on File Prior to Visit  Medication Sig Dispense Refill  . Calcium Carbonate-Vitamin D (CALTRATE 600+D) 600-400 MG-UNIT per tablet Take 1 tablet by mouth daily.     . diazepam (VALIUM) 5 MG tablet Take 5 mg by mouth at bedtime as needed. For anxiety    . fish oil-omega-3 fatty acids 1000 MG capsule Take 3 g by mouth daily.    . furosemide (LASIX) 20 MG tablet Take  2 tablets (40 mg total) by mouth daily. Please keep upcoming appt for future refills. Thank you 60 tablet 1  . hydrocortisone cream 1 % Apply 1 application topically 2 (two) times daily as needed. For insect bites    . metoprolol tartrate (LOPRESSOR) 100 MG tablet TAKE 1 TABLET (100 MG TOTAL) BY MOUTH 2 (TWO) TIMES DAILY. 180 tablet 2  . Multiple Vitamin (MULTIVITAMIN WITH MINERALS) TABS Take 1 tablet by mouth daily.    . nitroGLYCERIN (NITROSTAT) 0.4 MG SL tablet Place 1 tablet (0.4 mg total) under the tongue every 5 (five) minutes as needed for chest pain. 25 tablet 5  . XARELTO 20 MG TABS tablet TAKE 1 TABLET (20 MG TOTAL) BY MOUTH DAILY WITH SUPPER. 30 tablet 11   No current facility-administered medications on file prior to visit.      Allergies  Allergen Reactions  . Clindamycin/Lincomycin Diarrhea    Assessment/Plan: Hyperlipidemia: LDL not at goal. We had a lengthy discussion on options including dietary/lifestyle modifications, Zetia, or PCSK9i therapy. Discussed risks/benefits of each option. She would really like to avoid injections if at all possible and especially due to cost. She feels that dietary modifications may be the best option for her at this time. If this does not work she would be willing to try Zetia (we did discuss that there is no evidence for monotherapy with risk reduction outcomes). She would like to try the lifestyle modifications and follow up with Dr. Eldridge Dace for further discussion. Will order labs for a few days before visit so that he will have results. Also advised she seek counsel from dietician if able/willing to help with options, but did provide some dietary resources today. She will follow up with Dr. Eldridge Dace as scheduled and lipid clinic after if needed.   Thank you,  Freddie Apley. Cleatis Polka, PharmD  [facility]  02/26/2018 8:39 AM

## 2018-02-26 NOTE — Addendum Note (Signed)
Addended by: Levin Bacon on: 02/26/2018 02:40 PM   Modules accepted: Orders

## 2018-02-26 NOTE — Patient Instructions (Addendum)
Work on [employer]. Watch your fat intake. Limit/avoid potato chips.   If you are not able to get to goal with changing your diet we will consider the pill ezetimibe (ZETIA)  daily, which is ONLY the ezetimibe component and NOT a statin medication.   Fasting blood work - to check cholesterol with dietary change.   Cholesterol Cholesterol is a fat. Your body needs a small amount of cholesterol. Cholesterol (plaque) may build up in your blood vessels (arteries). That makes you more likely to have a heart attack or stroke. You cannot feel your cholesterol level. Having a blood test is the only way to find out if your level is high. Keep your test results. Work with your doctor to keep your cholesterol at a good level. What do the results mean?  Total cholesterol is how much cholesterol is in your blood.  LDL is bad cholesterol. This is the type that can build up. Try to have low LDL.  HDL is good cholesterol. It cleans your blood vessels and carries LDL away. Try to have high HDL.  Triglycerides are fat that the body can store or burn for energy. What are good levels of cholesterol?  Total cholesterol below 200.  LDL below 100 is good for people who have health risks. LDL below 70 is good for people who have very high risks.  HDL above 40 is good. It is best to have HDL of 60 or higher.  Triglycerides below 150. How can I lower my cholesterol? Diet Follow your diet program as told by your doctor.  Choose fish, white meat chicken, or [place] that is roasted or baked. Try not to eat red meat, fried foods, sausage, or lunch meats.  Eat lots of fresh fruits and vegetables.  Choose whole grains, beans, pasta, potatoes, and cereals.  Choose olive oil, corn oil, or canola oil. Only use small amounts.  Try not to eat butter, mayonnaise, shortening, or palm kernel oils.  Try not to eat foods with trans fats.  Choose low-fat or nonfat dairy foods. ? Drink skim or nonfat milk. ? Eat  low-fat or nonfat yogurt and cheeses. ? Try not to drink whole milk or cream. ? Try not to eat ice cream, egg yolks, or full-fat cheeses.  Healthy desserts include angel food cake, ginger snaps, animal crackers, hard candy, popsicles, and low-fat or nonfat frozen yogurt. Try not to eat pastries, cakes, pies, and cookies.  Exercise Follow your exercise program as told by your doctor.  Be more active. Try gardening, walking, and taking the stairs.  Ask your doctor about ways that you can be more active.  Medicine  Take over-the-counter and prescription medicines only as told by your doctor. This information is not intended to replace advice given to you by your health care provider. Make sure you discuss any questions you have with your health care provider. Document Released: 01/03/2009 Document Revised: 05/08/2016 Document Reviewed: 04/18/2016 Elsevier Interactive Patient Education  [employer].

## 2018-04-13 ENCOUNTER — Ambulatory Visit: Payer: Medicare Other | Admitting: Interventional Cardiology

## 2018-04-23 ENCOUNTER — Other Ambulatory Visit: Payer: Self-pay | Admitting: Interventional Cardiology

## 2018-04-24 NOTE — Telephone Encounter (Signed)
Age 84 years Wt 71.8kg  Saw DrVaranasi 04/01/2017  Has scheduled appt with Dr Eldridge DaceVaranasi on 05/14/2018 Labs done at PCP 04/09/2018 SrCr 0.85 Hgb13.4 Hct 40.3 CrCl 58.83 Refill done for Xarelto 20 mg daily as requested

## 2018-05-08 ENCOUNTER — Other Ambulatory Visit: Payer: Self-pay | Admitting: Interventional Cardiology

## 2018-05-12 ENCOUNTER — Other Ambulatory Visit: Payer: Medicare Other

## 2018-05-12 DIAGNOSIS — E785 Hyperlipidemia, unspecified: Secondary | ICD-10-CM

## 2018-05-12 LAB — HEPATIC FUNCTION PANEL
ALT: 17 IU/L (ref 0–32)
AST: 26 IU/L (ref 0–40)
Albumin: 4.5 g/dL (ref 3.5–4.7)
Alkaline Phosphatase: 79 IU/L (ref 39–117)
BILIRUBIN, DIRECT: 0.17 mg/dL (ref 0.00–0.40)
Bilirubin Total: 0.7 mg/dL (ref 0.0–1.2)
TOTAL PROTEIN: 6.4 g/dL (ref 6.0–8.5)

## 2018-05-12 LAB — LIPID PANEL
CHOL/HDL RATIO: 2.5 ratio (ref 0.0–4.4)
Cholesterol, Total: 195 mg/dL (ref 100–199)
HDL: 78 mg/dL (ref >39)
LDL CALC: 100 mg/dL — AB (ref 0–99)
TRIGLYCERIDES: 85 mg/dL (ref 0–149)
VLDL Cholesterol Cal: 17 mg/dL (ref 5–40)

## 2018-05-14 ENCOUNTER — Encounter: Payer: Self-pay | Admitting: Interventional Cardiology

## 2018-05-14 ENCOUNTER — Ambulatory Visit: Payer: Medicare Other | Admitting: Interventional Cardiology

## 2018-05-14 VITALS — BP 112/64 | HR 75 | Ht 67.0 in | Wt 164.8 lb

## 2018-05-14 DIAGNOSIS — I5032 Chronic diastolic (congestive) heart failure: Secondary | ICD-10-CM | POA: Diagnosis not present

## 2018-05-14 DIAGNOSIS — E785 Hyperlipidemia, unspecified: Secondary | ICD-10-CM | POA: Diagnosis not present

## 2018-05-14 DIAGNOSIS — I482 Chronic atrial fibrillation, unspecified: Secondary | ICD-10-CM

## 2018-05-14 DIAGNOSIS — I252 Old myocardial infarction: Secondary | ICD-10-CM | POA: Diagnosis not present

## 2018-05-14 NOTE — Patient Instructions (Signed)

## 2018-05-14 NOTE — Progress Notes (Signed)
Cardiology Office Note   Date:  05/14/2018   ID:  [name] Diaz, DOB 16-Dec-1933, MRN [id number]  PCP:  [name] Noble, MD    No chief complaint on file.  CAFib  Wt Readings from Last 3 Encounters:  05/14/18 164 lb 12.8 oz (74.8 kg)  04/01/17 162 lb 6.4 oz (73.7 kg)  04/01/16 160 lb 12.8 oz (72.9 kg)       History of Present Illness: [name] Diaz is a 84 y.o. female   who has had AFib and MI. She has had a stroke secondary to atrial fibrillation in the past. She had a DES to LAD in 2011.   MI was thought to be due to a possible left atrial appendage embolism down the coronary.   In the past, She has several rashes which may be related to Provera and Xarelto. THis has improved. She follows with dermatology.  She had coils placed in veins in her vulva due to bleeding in Jan 2016.  THis was done at [facility].  She has had some stress from an adult child.  SHe has had some mild depression    Since the last visit, she has had some dietary indiscretion and mild weight gain.  Her LDL has increased from 76 to 100. SHe will be moving to [place] in September 2019.    Past Medical History:  Diagnosis Date  . A-fib (HCC)   . Colonic diverticular disease   . Dysrhythmia    Atrial fib  . History of cataract surgery    with lens implant   . History of colon polyps   . Myocardial infarction [facility]) 2011   DES TO LAD  . Shortness of breath    on exertion    Past Surgical History:  Procedure Laterality Date  . EYE SURGERY     Bilateral Cataract surgery w/ lens implant    . HYSTEROSCOPY W/D&C  08/25/2012   Procedure: DILATATION AND CURETTAGE /HYSTEROSCOPY;  Surgeon: Geryl Rankins, MD;  Location: [facility];  Service: Gynecology;  Laterality: N/A;  TruClear  . VEIN LIGATION  1960s     Current Outpatient Medications  Medication Sig Dispense Refill  . Calcium Carbonate-Vitamin D (CALTRATE 600+D) 600-400 MG-UNIT per tablet Take 1 tablet by mouth daily.     . diazepam  (VALIUM) 5 MG tablet Take 5 mg by mouth at bedtime as needed. For anxiety    . fish oil-omega-3 fatty acids 1000 MG capsule Take 3 g by mouth daily.    . furosemide (LASIX) 20 MG tablet TAKE 2 TABLETS (40 MG TOTAL) BY MOUTH DAILY. PLEASE KEEP UPCOMING APPT FOR FUTURE REFILLS. THANK YOU 60 tablet 0  . metoprolol tartrate (LOPRESSOR) 100 MG tablet Take 1 tablet (100 mg total) by mouth 2 (two) times daily. Please keep upcoming appt in July with Dr. Eldridge Dace for future refills. Thank you 180 tablet 0  . Multiple Vitamin (MULTIVITAMIN WITH MINERALS) TABS Take 1 tablet by mouth daily.    . nitroGLYCERIN (NITROSTAT) 0.4 MG SL tablet Place 1 tablet (0.4 mg total) under the tongue every 5 (five) minutes as needed for chest pain. 25 tablet 5  . XARELTO 20 MG TABS tablet TAKE 1 TABLET (20 MG TOTAL) BY MOUTH DAILY WITH SUPPER. 30 tablet 5   No current facility-administered medications for this visit.     Allergies:   Clindamycin/lincomycin    Social History:  The patient  reports that she has quit smoking. Her smoking use included  cigarettes. She quit after 40.00 years of use. She has never used smokeless tobacco. She reports that she does not drink alcohol or use drugs.   Family History:  The patient's family history includes CVA in her mother; Heart attack in her brother.    ROS:  Please see the history of present illness.   Otherwise, review of systems are positive for weight gain.   All other systems are reviewed and negative.    PHYSICAL EXAM: VS:  BP 112/64   Pulse 75   Ht 5\' 7"  (1.702 m)   Wt 164 lb 12.8 oz (74.8 kg)   SpO2 98%   BMI 25.81 kg/m  , BMI Body mass index is 25.81 kg/m. GEN: Well nourished, well developed, in no acute distress  HEENT: normal  Neck: no JVD, carotid bruits, or masses Cardiac: irregularly irregular; no murmurs, rubs, or gallops,; tr ankle edema  Respiratory:  clear to auscultation bilaterally, normal work of breathing GI: soft, nontender, nondistended, +  BS MS: no deformity or atrophy  Skin: warm and dry, no rash Neuro:  Strength and sensation are intact Psych: euthymic mood, full affect   EKG:   The ekg ordered today demonstrates AFib , rate controlled   Recent Labs: 05/12/2018: ALT 17   Lipid Panel    Component Value Date/Time   CHOL 195 05/12/2018 0848   TRIG 85 05/12/2018 0848   HDL 78 05/12/2018 0848   CHOLHDL 2.5 05/12/2018 0848   CHOLHDL 1.7 03/25/2016 0842   VLDL 10 03/25/2016 0842   LDLCALC 100 (H) 05/12/2018 0848     Other studies Reviewed: Additional studies/ records that were reviewed today with results demonstrating: labs reviewed.   ASSESSMENT AND PLAN:  1. AFib: Rate controlled.  Continue current meds.  Xarelto for stroke prevention.   2. Chronic diastolic heart failure: She appears euvolemic.   3. Old MI: No CHF sx.  She prefers to try to improve her LDL with diet control.   4. Venous insufficiency: Elevate legs when possible.  COntinue diuretics.     Current medicines are reviewed at length with the patient today.  The patient concerns regarding her medicines were addressed.  The following changes have been made:  No change  Labs/ tests ordered today include:  No orders of the defined types were placed in this encounter.   Recommend 150 minutes/week of aerobic exercise Low fat, low carb, high fiber diet recommended  Disposition:   FU in 1 year   Signed, Lance Muss, MD  05/14/2018 1:32 PM    [facility] [address] [place], [place], [place]  [address] Phone: [phone]; Fax: [phone]

## 2018-05-24 ENCOUNTER — Other Ambulatory Visit: Payer: Self-pay | Admitting: Interventional Cardiology

## 2018-05-25 ENCOUNTER — Other Ambulatory Visit: Payer: Self-pay | Admitting: Obstetrics and Gynecology

## 2018-05-25 ENCOUNTER — Other Ambulatory Visit (HOSPITAL_COMMUNITY)
Admission: RE | Admit: 2018-05-25 | Discharge: 2018-05-25 | Disposition: A | Discharge status: Home / Self Care | Payer: Medicare Other | Source: Ambulatory Visit | Attending: Obstetrics and Gynecology | Admitting: Obstetrics and Gynecology

## 2018-05-25 DIAGNOSIS — Z01411 Encounter for gynecological examination (general) (routine) with abnormal findings: Secondary | ICD-10-CM | POA: Insufficient documentation

## 2018-05-29 LAB — CYTOLOGY - PAP
DIAGNOSIS: NEGATIVE
HPV (WINDOPATH): NOT DETECTED

## 2018-07-02 DIAGNOSIS — Z8719 Personal history of other diseases of the digestive system: Secondary | ICD-10-CM

## 2018-07-02 DIAGNOSIS — Z780 Asymptomatic menopausal state: Secondary | ICD-10-CM | POA: Insufficient documentation

## 2018-07-02 DIAGNOSIS — F329 Major depressive disorder, single episode, unspecified: Secondary | ICD-10-CM | POA: Insufficient documentation

## 2018-07-02 DIAGNOSIS — F419 Anxiety disorder, unspecified: Secondary | ICD-10-CM | POA: Insufficient documentation

## 2018-07-02 HISTORY — DX: Anxiety disorder, unspecified: F41.9

## 2018-07-02 HISTORY — DX: Personal history of other diseases of the digestive system: Z87.19

## 2018-07-02 HISTORY — DX: Major depressive disorder, single episode, unspecified: F32.9

## 2018-07-02 HISTORY — DX: Asymptomatic menopausal state: Z78.0

## 2018-07-09 ENCOUNTER — Other Ambulatory Visit: Payer: Self-pay | Admitting: *Deleted

## 2018-07-09 MED ORDER — METOPROLOL TARTRATE 100 MG PO TABS: 100.0000 mg | ORAL_TABLET | Freq: Two times a day (BID) | ORAL | 3 refills | Status: DC

## 2018-08-06 ENCOUNTER — Other Ambulatory Visit: Payer: Self-pay | Admitting: Interventional Cardiology

## 2018-08-06 NOTE — Telephone Encounter (Signed)
Outpatient Medication Detail    Disp Refills Start End   metoprolol tartrate (LOPRESSOR) 100 MG tablet 180 tablet 3 07/09/2018    Sig - Route: Take 1 tablet (100 mg total) by mouth 2 (two) times daily. - Oral   Sent to pharmacy as: metoprolol tartrate (LOPRESSOR) 100 MG tablet   E-Prescribing Status: Receipt confirmed by pharmacy (07/09/2018 11:36 AM EDT)   Pharmacy   [facility] - [place], [place] - [address]

## 2018-10-19 ENCOUNTER — Other Ambulatory Visit: Payer: Self-pay | Admitting: Interventional Cardiology

## 2019-02-18 ENCOUNTER — Other Ambulatory Visit: Payer: Self-pay | Admitting: Interventional Cardiology

## 2019-02-18 ENCOUNTER — Other Ambulatory Visit: Payer: Self-pay | Admitting: *Deleted

## 2019-02-18 MED ORDER — RIVAROXABAN 20 MG PO TABS: ORAL_TABLET | ORAL | 1 refills | Status: DC

## 2019-02-18 MED ORDER — FUROSEMIDE 20 MG PO TABS: 20.0000 mg | ORAL_TABLET | Freq: Two times a day (BID) | ORAL | 5 refills | Status: DC

## 2019-02-18 NOTE — Telephone Encounter (Signed)
[facility], [place], [place] [phone] refill request Xarelto 20mg 

## 2019-02-18 NOTE — Addendum Note (Signed)
Addended by: Jacqlyn Krauss on: 02/18/2019 01:02 PM   Modules accepted: Orders

## 2019-02-18 NOTE — Telephone Encounter (Signed)
Pt last saw Dr Eldridge Dace 05/14/18, last labs 04/09/18 Creat 0.85, age 85, weight 74.8kg, CrCl 60.26, based on CrCl pt is on appropriate dosage of Xarelto 20mg  QD.  Will refill rx.

## 2019-03-23 ENCOUNTER — Telehealth: Payer: Self-pay | Admitting: Interventional Cardiology

## 2019-03-23 DIAGNOSIS — I482 Chronic atrial fibrillation, unspecified: Secondary | ICD-10-CM

## 2019-03-23 DIAGNOSIS — Z7901 Long term (current) use of anticoagulants: Secondary | ICD-10-CM

## 2019-03-23 DIAGNOSIS — I5032 Chronic diastolic (congestive) heart failure: Secondary | ICD-10-CM

## 2019-03-23 DIAGNOSIS — E785 Hyperlipidemia, unspecified: Secondary | ICD-10-CM

## 2019-03-23 NOTE — Telephone Encounter (Signed)
I will route to covering RN for Dr. Eldridge Dace as this pt is not scheduled until 06/25/19.

## 2019-03-23 NOTE — Telephone Encounter (Signed)
New Message  Patient has been scheduled for her one year follow up and is requesting that she has her blood work done the same day as her appointment which is 06/25/2019 at 10:00am. Could you please assist with putting in an order for labs? Thanks.

## 2019-03-24 ENCOUNTER — Telehealth: Payer: Self-pay | Admitting: Interventional Cardiology

## 2019-03-24 NOTE — Telephone Encounter (Signed)
Left message for patient to call back  

## 2019-03-24 NOTE — Telephone Encounter (Signed)
See other phone note

## 2019-03-24 NOTE — Telephone Encounter (Signed)
New message:   Patient calling stating that some called her today and states she has been waiting on some one to give her a call back.

## 2019-03-24 NOTE — Telephone Encounter (Signed)
Called and spoke to patient and made her aware that fasting labs have been scheduled for 06/25/19 before her OV with JV.

## 2019-03-24 NOTE — Telephone Encounter (Signed)
Follow up  ° ° °Pt is returning call  ° ° °Please call back  °

## 2019-06-18 ENCOUNTER — Other Ambulatory Visit: Payer: Self-pay

## 2019-06-18 ENCOUNTER — Other Ambulatory Visit: Payer: Medicare Other | Admitting: *Deleted

## 2019-06-18 DIAGNOSIS — I5032 Chronic diastolic (congestive) heart failure: Secondary | ICD-10-CM

## 2019-06-18 DIAGNOSIS — I482 Chronic atrial fibrillation, unspecified: Secondary | ICD-10-CM

## 2019-06-18 DIAGNOSIS — E785 Hyperlipidemia, unspecified: Secondary | ICD-10-CM

## 2019-06-18 DIAGNOSIS — Z7901 Long term (current) use of anticoagulants: Secondary | ICD-10-CM

## 2019-06-18 LAB — COMPREHENSIVE METABOLIC PANEL
ALT: 15 IU/L (ref 0–32)
AST: 23 IU/L (ref 0–40)
Albumin/Globulin Ratio: 3.1 — ABNORMAL HIGH (ref 1.2–2.2)
Albumin: 4.6 g/dL (ref 3.6–4.6)
Alkaline Phosphatase: 80 IU/L (ref 39–117)
BUN/Creatinine Ratio: 20 (ref 12–28)
BUN: 16 mg/dL (ref 8–27)
Bilirubin Total: 0.5 mg/dL (ref 0.0–1.2)
CO2: 27 mmol/L (ref 20–29)
Calcium: 9.4 mg/dL (ref 8.7–10.3)
Chloride: 100 mmol/L (ref 96–106)
Creatinine, Ser: 0.81 mg/dL (ref 0.57–1.00)
GFR calc Af Amer: 78 mL/min/{1.73_m2} (ref >59)
GFR calc non Af Amer: 67 mL/min/{1.73_m2} (ref >59)
Globulin, Total: 1.5 g/dL (ref 1.5–4.5)
Glucose: 102 mg/dL — ABNORMAL HIGH (ref 65–99)
Potassium: 4.1 mmol/L (ref 3.5–5.2)
Sodium: 142 mmol/L (ref 134–144)
Total Protein: 6.1 g/dL (ref 6.0–8.5)

## 2019-06-18 LAB — LIPID PANEL
Chol/HDL Ratio: 2.5 ratio (ref 0.0–4.4)
Cholesterol, Total: 188 mg/dL (ref 100–199)
HDL: 74 mg/dL (ref >39)
LDL Calculated: 101 mg/dL — ABNORMAL HIGH (ref 0–99)
Triglycerides: 67 mg/dL (ref 0–149)
VLDL Cholesterol Cal: 13 mg/dL (ref 5–40)

## 2019-06-18 LAB — CBC
Hematocrit: 35.9 % (ref 34.0–46.6)
Hemoglobin: 11.9 g/dL (ref 11.1–15.9)
MCH: 30.5 pg (ref 26.6–33.0)
MCHC: 33.1 g/dL (ref 31.5–35.7)
MCV: 92 fL (ref 79–97)
Platelets: 179 10*3/uL (ref 150–450)
RBC: 3.9 x10E6/uL (ref 3.77–5.28)
RDW: 12.8 % (ref 11.7–15.4)
WBC: 13.3 10*3/uL — ABNORMAL HIGH (ref 3.4–10.8)

## 2019-06-24 NOTE — Progress Notes (Signed)
Cardiology Office Note   Date:  06/25/2019   ID:  [name] Diaz, DOB Sep 28, 1934, MRN [id number]  PCP:  Marden NobleGates, Robert, MD    No chief complaint on file.  AFib  Wt Readings from Last 3 Encounters:  06/25/19 158 lb 12.8 oz (72 kg)  05/14/18 164 lb 12.8 oz (74.8 kg)  04/01/17 162 lb 6.4 oz (73.7 kg)       History of Present Illness: [name] Diaz is a 85 y.o. female  who has had AFib and MI. She has had a stroke secondary to atrial fibrillation in the past. She had aDES toLADin2011. MI was thought to be due to a possible left atrial appendage embolism down the coronary.   In the past,She has several rashes which may be related to Provera and Xarelto. THis has improved. She follows with dermatology.  She had coils placed in veins in her vulva due to bleeding in Jan 2016.THis was done [facility].  She has had some stress from an adult child. SHe has had some mild depression.  SHe moved to [place] in September 2019.  THey have had a few staff that have been COVID positive.  No residents have tested positive.  THey are strict about distancing.    She feels a little more out of breath when she walks.   Denies : Chest pain. Dizziness. Leg edema. Nitroglycerin use. Orthopnea. Palpitations. Paroxysmal nocturnal dyspnea. Syncope.   She feels some neck pressure at times when she walks.  Seems related to her shortness of breath at times.    She stopped statins due to rash.  SHe did not want PCSK-9 inhibitor.    Past Medical History:  Diagnosis Date  . A-fib (HCC)   . Colonic diverticular disease   . Dysrhythmia    Atrial fib  . History of cataract surgery    with lens implant   . History of colon polyps   . Myocardial infarction [facility]) 2011   DES TO LAD  . Shortness of breath    on exertion    Past Surgical History:  Procedure Laterality Date  . EYE SURGERY     Bilateral Cataract surgery w/ lens implant    . HYSTEROSCOPY W/D&C  08/25/2012   Procedure: DILATATION AND CURETTAGE /HYSTEROSCOPY;  Surgeon: Geryl RankinsEvelyn Varnado, MD;  Location: [facility];  Service: Gynecology;  Laterality: N/A;  TruClear  . VEIN LIGATION  1960s     Current Outpatient Medications  Medication Sig Dispense Refill  . Calcium Carbonate-Vitamin D (CALTRATE 600+D) 600-400 MG-UNIT per tablet Take 1 tablet by mouth daily.     . diazepam (VALIUM) 5 MG tablet Take 5 mg by mouth at bedtime as needed. For anxiety    . fish oil-omega-3 fatty acids 1000 MG capsule Take 3 g by mouth daily.    . furosemide (LASIX) 20 MG tablet Take 1 tablet (20 mg total) by mouth 2 (two) times daily. 60 tablet 5  . metoprolol tartrate (LOPRESSOR) 100 MG tablet Take 1 tablet (100 mg total) by mouth 2 (two) times daily. 180 tablet 3  . Multiple Vitamin (MULTIVITAMIN WITH MINERALS) TABS Take 1 tablet by mouth daily.    . nitroGLYCERIN (NITROSTAT) 0.4 MG SL tablet Place 1 tablet (0.4 mg total) under the tongue every 5 (five) minutes as needed for chest pain. 25 tablet 5  . rivaroxaban (XARELTO) 20 MG TABS tablet TAKE ONE (1) TABLET BY MOUTH EVERY DAY WITH SUPPER 90 tablet 1  No current facility-administered medications for this visit.     Allergies:   Clindamycin/lincomycin, Pollen extract, and Simvastatin    Social History:  The patient  reports that she has quit smoking. Her smoking use included cigarettes. She quit after 40.00 years of use. She has never used smokeless tobacco. She reports that she does not drink alcohol or use drugs.   Family History:  The patient's family history includes CVA in her mother; Heart attack in her brother.    ROS:  Please see the history of present illness.   Otherwise, review of systems are positive for DOE.   All other systems are reviewed and negative.    PHYSICAL EXAM: VS:  BP 114/60   Pulse 74   Ht 5\' 7"  (1.702 m)   Wt 158 lb 12.8 oz (72 kg)   SpO2 97%   BMI 24.87 kg/m  , BMI Body mass index is 24.87 kg/m. GEN: Well nourished, well developed,  in no acute distress  HEENT: normal  Neck: no JVD, carotid bruits, or masses Cardiac:  Irregularly irregular; no murmurs, rubs, or gallops,no edema  Respiratory:  clear to auscultation bilaterally, normal work of breathing GI: soft, nontender, nondistended, + BS MS: no deformity or atrophy ; 2+ DP pulses bilaterally Skin: warm and dry, no rash Neuro:  Strength and sensation are intact Psych: euthymic mood, full affect   EKG:   The ekg ordered today demonstrates AFib, rate controlled   Recent Labs: 06/18/2019: ALT 15; BUN 16; Creatinine, Ser 0.81; Hemoglobin 11.9; Platelets 179; Potassium 4.1; Sodium 142   Lipid Panel    Component Value Date/Time   CHOL 188 06/18/2019 0843   TRIG 67 06/18/2019 0843   HDL 74 06/18/2019 0843   CHOLHDL 2.5 06/18/2019 0843   CHOLHDL 1.7 03/25/2016 0842   VLDL 10 03/25/2016 0842   LDLCALC 101 (H) 06/18/2019 0843     Other studies Reviewed: Additional studies/ records that were reviewed today with results demonstrating: labs reviewed.   ASSESSMENT AND PLAN:  1. AFib: Rate controlled. TOlerating anticoagulation. 2. Chronic diastolic heart failure: Appears euvolemic. 3. Old MI: MI in 2011.  LAD stent.  No CHF signs.  Will see EF from nuclear stress test.  Check for ischemia given DOE.  4. Venous insufficiency: No arterial issues.  Elevate legs.  5. Anticoagulated: No bleeding issues.  6. Neck pain plan carotid Doppler given prior history.   Current medicines are reviewed at length with the patient today.  The patient concerns regarding her medicines were addressed.  The following changes have been made:  No change  Labs/ tests ordered today include:  No orders of the defined types were placed in this encounter.   Recommend 150 minutes/week of aerobic exercise Low fat, low carb, high fiber diet recommended  Disposition:   FU in for stress test, and 1 year   Signed, Larae Grooms, MD  06/25/2019 10:18 AM    [facility] [place], [place], [place]  [address] Phone: [phone]; Fax: [phone]

## 2019-06-25 ENCOUNTER — Other Ambulatory Visit: Payer: Self-pay

## 2019-06-25 ENCOUNTER — Encounter: Payer: Self-pay | Admitting: Interventional Cardiology

## 2019-06-25 ENCOUNTER — Other Ambulatory Visit: Payer: Medicare Other

## 2019-06-25 ENCOUNTER — Ambulatory Visit (INDEPENDENT_AMBULATORY_CARE_PROVIDER_SITE_OTHER): Payer: Medicare Other | Admitting: Interventional Cardiology

## 2019-06-25 VITALS — BP 114/60 | HR 74 | Ht 67.0 in | Wt 158.8 lb

## 2019-06-25 DIAGNOSIS — I252 Old myocardial infarction: Secondary | ICD-10-CM

## 2019-06-25 DIAGNOSIS — E785 Hyperlipidemia, unspecified: Secondary | ICD-10-CM | POA: Diagnosis not present

## 2019-06-25 DIAGNOSIS — R0609 Other forms of dyspnea: Secondary | ICD-10-CM

## 2019-06-25 DIAGNOSIS — M542 Cervicalgia: Secondary | ICD-10-CM

## 2019-06-25 DIAGNOSIS — Z7901 Long term (current) use of anticoagulants: Secondary | ICD-10-CM

## 2019-06-25 DIAGNOSIS — I5032 Chronic diastolic (congestive) heart failure: Secondary | ICD-10-CM

## 2019-06-25 DIAGNOSIS — I482 Chronic atrial fibrillation, unspecified: Secondary | ICD-10-CM

## 2019-06-25 NOTE — Patient Instructions (Addendum)
Medication Instructions:  Your physician recommends that you continue on your current medications as directed. Please refer to the Current Medication list given to you today.  If you need a refill on your cardiac medications before your next appointment, please call your pharmacy.   Lab work: None Ordered  If you have labs (blood work) drawn today and your tests are completely normal, you will receive your results only by: Marland Kitchen MyChart Message (if you have MyChart) OR . A paper copy in the mail If you have any lab test that is abnormal or we need to change your treatment, we will call you to review the results.  Testing/Procedures: Your physician has requested that you have a [place] [facility] [place].For further information please visit [email or web address]. Please follow instruction sheet, as given.   Your physician has requested that you have a carotid duplex. This test is an ultrasound of the carotid arteries in your neck. It looks at blood flow through these arteries that supply the brain with blood. Allow one hour for this exam. There are no restrictions or special instructions.    Follow-Up: At [facility], you and your health needs are our priority.  As part of our continuing mission to provide you with exceptional heart care, we have created designated Provider Care Teams.  These Care Teams include your primary Cardiologist (physician) and Advanced Practice Providers (APPs -  Physician Assistants and Nurse Practitioners) who all work together to provide you with the care you need, when you need it. You will need a follow up appointment in 1 year.  Please call our office 2 months in advance to schedule this appointment.  You may see Dr. Irish Lack or one of the following Advanced Practice Providers on your designated Care Team:   [place], PA-C Melina Copa, PA-C . Ermalinda Barrios, PA-C  Any Other Special Instructions Will Be Listed Below (If Applicable).

## 2019-07-06 ENCOUNTER — Telehealth (HOSPITAL_COMMUNITY): Payer: Self-pay

## 2019-07-06 NOTE — Telephone Encounter (Signed)
Encounter complete. 

## 2019-07-08 ENCOUNTER — Ambulatory Visit (HOSPITAL_COMMUNITY)
Admission: RE | Admit: 2019-07-08 | Payer: Medicare Other | Source: Ambulatory Visit | Attending: Interventional Cardiology | Admitting: Interventional Cardiology

## 2019-07-09 ENCOUNTER — Telehealth (HOSPITAL_COMMUNITY): Payer: Self-pay

## 2019-07-09 NOTE — Telephone Encounter (Signed)
New message    The patient called to reschedule her Carotid & Myocardial Perfusion on yesterday due to bad weather.   The patient is asking for Dr. Christ Kick nurse to call her back to explain both of imagining test.   Upcoming appt on  07/21/19 @ [facility].  The patient is aware if Dr. Irish Lack nurse is not available a triage nurse will call her back today or Monday, 9/21.

## 2019-07-09 NOTE — Telephone Encounter (Signed)
I spoke to the patient and gave her information about her upcoming Carotid US and Nuclear Stress Test.  She was appreciative of the explanation.  She verbalized understanding.

## 2019-07-16 ENCOUNTER — Telehealth (HOSPITAL_COMMUNITY): Payer: Self-pay

## 2019-07-16 NOTE — Telephone Encounter (Signed)
Encounter complete. 

## 2019-07-21 ENCOUNTER — Ambulatory Visit (HOSPITAL_BASED_OUTPATIENT_CLINIC_OR_DEPARTMENT_OTHER)
Admission: RE | Admit: 2019-07-21 | Discharge: 2019-07-21 | Disposition: A | Discharge status: Home / Self Care | Payer: Medicare Other | Source: Ambulatory Visit | Attending: Cardiovascular Disease | Admitting: Cardiovascular Disease

## 2019-07-21 ENCOUNTER — Other Ambulatory Visit: Payer: Self-pay

## 2019-07-21 ENCOUNTER — Ambulatory Visit (HOSPITAL_COMMUNITY)
Admission: RE | Admit: 2019-07-21 | Discharge: 2019-07-21 | Disposition: A | Discharge status: Home / Self Care | Payer: Medicare Other | Source: Ambulatory Visit | Attending: Cardiovascular Disease | Admitting: Cardiovascular Disease

## 2019-07-21 DIAGNOSIS — E785 Hyperlipidemia, unspecified: Secondary | ICD-10-CM

## 2019-07-21 DIAGNOSIS — I482 Chronic atrial fibrillation, unspecified: Secondary | ICD-10-CM | POA: Diagnosis not present

## 2019-07-21 DIAGNOSIS — R0609 Other forms of dyspnea: Secondary | ICD-10-CM | POA: Diagnosis not present

## 2019-07-21 DIAGNOSIS — I2510 Atherosclerotic heart disease of native coronary artery without angina pectoris: Secondary | ICD-10-CM | POA: Diagnosis not present

## 2019-07-21 DIAGNOSIS — M542 Cervicalgia: Secondary | ICD-10-CM | POA: Diagnosis not present

## 2019-07-21 LAB — MYOCARDIAL PERFUSION IMAGING
LV dias vol: 66 mL (ref 46–106)
LV sys vol: 20 mL
Peak HR: 89 {beats}/min
Rest HR: 70 {beats}/min
SDS: 2
SRS: 0
SSS: 2
TID: 1.01

## 2019-07-21 MED ORDER — REGADENOSON 0.4 MG/5ML IV SOLN
0.4000 mg | Freq: Once | INTRAVENOUS | Status: AC
  Administered 2019-07-21: 0.4 mg via INTRAVENOUS

## 2019-07-21 MED ORDER — TECHNETIUM TC 99M TETROFOSMIN IV KIT
30.4000 | PACK | Freq: Once | INTRAVENOUS | Status: AC | PRN
  Administered 2019-07-21: 30.4 via INTRAVENOUS
  Filled 2019-07-21: qty 31

## 2019-07-21 MED ORDER — TECHNETIUM TC 99M TETROFOSMIN IV KIT
10.9000 | PACK | Freq: Once | INTRAVENOUS | Status: AC | PRN
  Administered 2019-07-21: 10.9 via INTRAVENOUS
  Filled 2019-07-21: qty 11

## 2019-07-30 ENCOUNTER — Telehealth: Payer: Self-pay | Admitting: Interventional Cardiology

## 2019-07-30 DIAGNOSIS — R079 Chest pain, unspecified: Secondary | ICD-10-CM

## 2019-07-30 NOTE — Telephone Encounter (Signed)
New Message  Patient is calling back due to her receiving her results in the mail and is now calling back to have someone go over them with her. Please give patient a call back.

## 2019-07-30 NOTE — Telephone Encounter (Signed)
Left message for patient to call back  

## 2019-08-03 NOTE — Telephone Encounter (Signed)
Left message for patient to call back  

## 2019-08-03 NOTE — Telephone Encounter (Signed)
New message ° ° °Patient is returning call for lab results. Please call. °

## 2019-08-10 ENCOUNTER — Telehealth: Payer: Self-pay | Admitting: Interventional Cardiology

## 2019-08-10 NOTE — Telephone Encounter (Signed)
Left message for patient to call back  

## 2019-08-10 NOTE — Telephone Encounter (Signed)
New message:    Patient would like for some one to call her concerning her result she do not understand them. Please call patient.

## 2019-08-10 NOTE — Telephone Encounter (Signed)
Follow up:    Patient would like for Ms. Currie to call back.

## 2019-08-12 ENCOUNTER — Other Ambulatory Visit: Payer: Self-pay | Admitting: Interventional Cardiology

## 2019-08-12 NOTE — Telephone Encounter (Signed)
Spoke with patient regarding the results of the lexiscan stress test and the carotid ultrasound that she received in the mail from [place]. The patient seems very agitated and is asking me questions but when I attempt to answer her question she interrupts me. She states how is she supposed to know what all of these numbers and words are supposed to mean and wanted me to go line by line through the test to interpret. I advised her that I am happy to answer her questions to the best of my ability but that the interpretation goes to the MD because of their medical knowledge and training. I answered her questions and advised her that the overall interpretation by Dr. Irish Lack is that neither test warrants further testing at this time. She states she trusts his interpretation because he is the only cardiologist she has ever seen. I gave her my name and she thanked me for answering her questions and states [place] does not need to call back unless she has something to add.

## 2019-08-12 NOTE — Telephone Encounter (Signed)
Patient called back to go over test results. She would like [place] to go over the results with her. She sees that they are on MyChart, but she does not understand what the results mean. She goes to lunch from 11-12, but will be in her apartment the remainder of the day  Please call to discuss results

## 2019-08-13 NOTE — Telephone Encounter (Signed)
Prescription refill request for Xarelto received.   Last office visit: Dr. Irish Lack (06-25-2019) Weight: 72 kg Age: 85 y.o. Scr: 0.81 (06-18-2019) CrCl: 60 ml/min  Prescription refill sent.

## 2019-08-18 NOTE — Telephone Encounter (Signed)
See telephone encounter 10/20

## 2019-11-25 ENCOUNTER — Other Ambulatory Visit: Payer: Self-pay | Admitting: Interventional Cardiology

## 2020-02-21 ENCOUNTER — Other Ambulatory Visit: Payer: Self-pay | Admitting: Interventional Cardiology

## 2020-02-21 NOTE — Telephone Encounter (Signed)
Pt last saw Dr Eldridge Dace 06/25/19, last labs 06/18/19 Creat 0.81, age 86, weight 71.7kg, CrCl 59.57, based on CrCl pt is on appropriate dosage of Xarelto 20mg  QD.  Will refill rx.

## 2020-05-01 ENCOUNTER — Telehealth: Payer: Self-pay | Admitting: Interventional Cardiology

## 2020-05-01 DIAGNOSIS — Z7901 Long term (current) use of anticoagulants: Secondary | ICD-10-CM

## 2020-05-01 DIAGNOSIS — E785 Hyperlipidemia, unspecified: Secondary | ICD-10-CM

## 2020-05-01 NOTE — Telephone Encounter (Signed)
Patient would like to know if she needs any lab work before her appointment 07/13/2020.

## 2020-05-01 NOTE — Telephone Encounter (Signed)
Attempted to contact pt.  Phone rang several times with no answer and no VM.  Pt does need to come in for fasting labs unless she has had recent labs at PCP.  Will place order for BMET, Liver, Lipid and CBC.  Can be done anytime prior to her appt.

## 2020-05-03 NOTE — Telephone Encounter (Signed)
I spoke with patient.  She is seeing her PCP on July 26,2021.  I told her as long as lab work was checked at that time she did not need repeated prior to seeing Dr Eldridge Dace in September.  She will ask PCP to include BMP, Liver and lipid profiles and CBC in planned lab work.  I asked her to have PCP send results to Dr Eldridge Dace. Patient asked about stress test results and carotid doppler results from September 2020.  I went over these results with patient.

## 2020-07-03 ENCOUNTER — Telehealth: Payer: Self-pay | Admitting: Interventional Cardiology

## 2020-07-03 NOTE — Telephone Encounter (Signed)
New Message:    Pt says she would like to have her lab work prior to her office visit on 07-13-20 with Dr Eldridge Dace. She also would like a copy of her lab results, so she can them with her for her office visit on 07-13-20. Please let he know today if you can do this. If she is not at home, please leave her a message.

## 2020-07-03 NOTE — Telephone Encounter (Signed)
Called and spoke to the patient. She requested to have her labs the day before her appointment with Dr. Eldridge Dace on 9/23. I scheduled her for a fasting lab appointment on 9/22.

## 2020-07-12 ENCOUNTER — Other Ambulatory Visit: Payer: Self-pay

## 2020-07-12 ENCOUNTER — Other Ambulatory Visit: Payer: Medicare PPO

## 2020-07-12 DIAGNOSIS — E785 Hyperlipidemia, unspecified: Secondary | ICD-10-CM

## 2020-07-12 DIAGNOSIS — Z7901 Long term (current) use of anticoagulants: Secondary | ICD-10-CM | POA: Diagnosis not present

## 2020-07-12 LAB — CBC
Hematocrit: 39.3 % (ref 34.0–46.6)
Hemoglobin: 12.6 g/dL (ref 11.1–15.9)
MCH: 27.1 pg (ref 26.6–33.0)
MCHC: 32.1 g/dL (ref 31.5–35.7)
MCV: 85 fL (ref 79–97)
Platelets: 204 10*3/uL (ref 150–450)
RBC: 4.65 x10E6/uL (ref 3.77–5.28)
RDW: 16 % — ABNORMAL HIGH (ref 11.7–15.4)
WBC: 13.9 10*3/uL — ABNORMAL HIGH (ref 3.4–10.8)

## 2020-07-12 LAB — BASIC METABOLIC PANEL
BUN/Creatinine Ratio: 22 (ref 12–28)
BUN: 19 mg/dL (ref 8–27)
CO2: 28 mmol/L (ref 20–29)
Calcium: 9.6 mg/dL (ref 8.7–10.3)
Chloride: 97 mmol/L (ref 96–106)
Creatinine, Ser: 0.85 mg/dL (ref 0.57–1.00)
GFR calc Af Amer: 73 mL/min/{1.73_m2} (ref >59)
GFR calc non Af Amer: 63 mL/min/{1.73_m2} (ref >59)
Glucose: 103 mg/dL — ABNORMAL HIGH (ref 65–99)
Potassium: 4.3 mmol/L (ref 3.5–5.2)
Sodium: 141 mmol/L (ref 134–144)

## 2020-07-12 LAB — HEPATIC FUNCTION PANEL
ALT: 12 IU/L (ref 0–32)
AST: 24 IU/L (ref 0–40)
Albumin: 4.7 g/dL — ABNORMAL HIGH (ref 3.6–4.6)
Alkaline Phosphatase: 87 IU/L (ref 44–121)
Bilirubin Total: 0.5 mg/dL (ref 0.0–1.2)
Bilirubin, Direct: 0.16 mg/dL (ref 0.00–0.40)
Total Protein: 6.6 g/dL (ref 6.0–8.5)

## 2020-07-12 LAB — LIPID PANEL
Chol/HDL Ratio: 2.7 ratio (ref 0.0–4.4)
Cholesterol, Total: 214 mg/dL — ABNORMAL HIGH (ref 100–199)
HDL: 80 mg/dL (ref >39)
LDL Chol Calc (NIH): 118 mg/dL — ABNORMAL HIGH (ref 0–99)
Triglycerides: 90 mg/dL (ref 0–149)
VLDL Cholesterol Cal: 16 mg/dL (ref 5–40)

## 2020-07-12 NOTE — Progress Notes (Signed)
Cardiology Office Note   Date:  07/13/2020   ID:  [name] Diaz, DOB 03/11/1934, MRN [id number]  PCP:  Marden Noble, MD    No chief complaint on file.  CAD  Wt Readings from Last 3 Encounters:  07/13/20 162 lb 12.8 oz (73.8 kg)  07/21/19 158 lb (71.7 kg)  06/25/19 158 lb 12.8 oz (72 kg)       History of Present Illness: [name] Diaz is a 86 y.o. female  who has had AFib and MI. She has had a stroke secondary to atrial fibrillation in the past. She had aDES toLADin2011. MI was thought to be due to a possible left atrial appendage embolism down the coronary.   In the past,She has several rashes which may be related to Provera and Xarelto. THis has improved. She follows with dermatology.  She had coils placed in veins in her vulva due to bleeding in Jan 2016.This was done at [facility].  [facility] moved to [place] in September 2019.  She stopped statins due to rash.  SHe did not want PCSK-9 inhibitor.   She continues to stay at [facility].  She thinks she can improve her diet.    Her exercise has decreased.  She hopes to increase this.  She walks around her apartment complex.    Past Medical History:  Diagnosis Date  . A-fib (HCC)   . Colonic diverticular disease   . Dysrhythmia    Atrial fib  . History of cataract surgery    with lens implant   . History of colon polyps   . Myocardial infarction [facility]) 2011   DES TO LAD  . Shortness of breath    on exertion    Past Surgical History:  Procedure Laterality Date  . EYE SURGERY     Bilateral Cataract surgery w/ lens implant    . HYSTEROSCOPY WITH D & C  08/25/2012   Procedure: DILATATION AND CURETTAGE /HYSTEROSCOPY;  Surgeon: Geryl Rankins, MD;  Location: [facility];  Service: Gynecology;  Laterality: N/A;  TruClear  . VEIN LIGATION  1960s     Current Outpatient Medications  Medication Sig Dispense Refill  . Calcium Carbonate-Vitamin D (CALTRATE 600+D) 600-400 MG-UNIT per tablet Take 1 tablet  by mouth daily.     . diazepam (VALIUM) 5 MG tablet Take 5 mg by mouth at bedtime as needed. For anxiety    . fish oil-omega-3 fatty acids 1000 MG capsule Take 3 g by mouth daily.    . furosemide (LASIX) 20 MG tablet TAKE ONE TABLET BY MOUTH TWICE DAILY 180 tablet 3  . metoprolol tartrate (LOPRESSOR) 100 MG tablet TAKE ONE TABLET BY MOUTH TWICE DAILY 180 tablet 3  . Multiple Vitamin (MULTIVITAMIN WITH MINERALS) TABS Take 1 tablet by mouth daily.    . nitroGLYCERIN (NITROSTAT) 0.4 MG SL tablet Place 1 tablet (0.4 mg total) under the tongue every 5 (five) minutes as needed for chest pain. 25 tablet 5  . XARELTO 20 MG TABS tablet TAKE ONE (1) TABLET BY MOUTH EACH DAY WITH SUPPER 90 tablet 1   No current facility-administered medications for this visit.    Allergies:   Clindamycin/lincomycin, Pollen extract, and Simvastatin    Social History:  The patient  reports that she has quit smoking. Her smoking use included cigarettes. She quit after 40.00 years of use. She has never used smokeless tobacco. She reports that she does not drink alcohol and does not use drugs.   Family  History:  The patient's family history includes CVA in her mother; Heart attack in her brother.    ROS:  Please see the history of present illness.   Otherwise, review of systems are positive for stable DOE.   All other systems are reviewed and negative.    PHYSICAL EXAM: VS:  BP 110/70   Pulse 69   Ht 5' 7.5" (1.715 m) Comment: estimated height, pt states she has strunk  Wt 162 lb 12.8 oz (73.8 kg)   SpO2 97%   BMI 25.12 kg/m  , BMI Body mass index is 25.12 kg/m. GEN: Well nourished, well developed, in no acute distress  HEENT: normal  Neck: no JVD, carotid bruits, or masses Cardiac: irregularly irregular; no murmurs, rubs, or gallops,no edema  Respiratory:  clear to auscultation bilaterally, normal work of breathing GI: soft, nontender, nondistended, + BS MS: no deformity or atrophy ; prominent varicose  veins Skin: warm and dry, no rash Neuro:  Strength and sensation are intact Psych: euthymic mood, full affect   EKG:   The ekg ordered today demonstrates AFib, rate controlled.   Recent Labs: 07/12/2020: ALT 12; BUN 19; Creatinine, Ser 0.85; Hemoglobin 12.6; Platelets 204; Potassium 4.3; Sodium 141   Lipid Panel    Component Value Date/Time   CHOL 214 (H) 07/12/2020 1021   TRIG 90 07/12/2020 1021   HDL 80 07/12/2020 1021   CHOLHDL 2.7 07/12/2020 1021   CHOLHDL 1.7 03/25/2016 0842   VLDL 10 03/25/2016 0842   LDLCALC 118 (H) 07/12/2020 1021     Other studies Reviewed: Additional studies/ records that were reviewed today with results demonstrating: labs reviewed, LDL has increased to 118 from 2020.   2019 carotid Doppler: mild plaque.     ASSESSMENT AND PLAN:  1. AFib: Rate controlled. Xarelto for stroke prevention.  No bleeding issues. 2. Chronic diastolic heart failure: Appears euvolemic.  3. CAD/Old MI: No angina. Continue aggressive secondary prevention.  4. Venous insufficiency: Elevate legs.  She is bothered by the veins noticed on her skin. 5. Anticoagulated: No bleeding issues.  6. Hyperlipidemia: LDL has increased.  HDL remains high.  She did no tolerate statins.  7. We went over her labs in detail.  She complained about her experience at [facility] vascular dept.   Current medicines are reviewed at length with the patient today.  The patient concerns regarding her medicines were addressed.  The following changes have been made:  No change  Labs/ tests ordered today include:  No orders of the defined types were placed in this encounter.   Recommend 150 minutes/week of aerobic exercise Low fat, low carb, high fiber diet recommended  Disposition:   FU in 1 year   Signed, Lance Muss, MD  07/13/2020 1:46 PM    [facility] [address] [place], [place], [place]  [address] Phone: [phone]; Fax: [phone]

## 2020-07-13 ENCOUNTER — Ambulatory Visit: Payer: Medicare PPO | Admitting: Interventional Cardiology

## 2020-07-13 ENCOUNTER — Encounter: Payer: Self-pay | Admitting: Interventional Cardiology

## 2020-07-13 ENCOUNTER — Other Ambulatory Visit: Payer: Self-pay

## 2020-07-13 VITALS — BP 110/70 | HR 69 | Ht 67.5 in | Wt 162.8 lb

## 2020-07-13 DIAGNOSIS — E785 Hyperlipidemia, unspecified: Secondary | ICD-10-CM

## 2020-07-13 DIAGNOSIS — I482 Chronic atrial fibrillation, unspecified: Secondary | ICD-10-CM

## 2020-07-13 DIAGNOSIS — I872 Venous insufficiency (chronic) (peripheral): Secondary | ICD-10-CM

## 2020-07-13 DIAGNOSIS — I252 Old myocardial infarction: Secondary | ICD-10-CM

## 2020-07-13 DIAGNOSIS — Z7901 Long term (current) use of anticoagulants: Secondary | ICD-10-CM | POA: Diagnosis not present

## 2020-07-13 NOTE — Patient Instructions (Signed)
Medication Instructions:  Your physician recommends that you continue on your current medications as directed. Please refer to the Current Medication list given to you today.  *If you need a refill on your cardiac medications before your next appointment, please call your pharmacy*   Lab Work: None  If you have labs (blood work) drawn today and your tests are completely normal, you will receive your results only by: Marland Kitchen MyChart Message (if you have MyChart) OR . A paper copy in the mail If you have any lab test that is abnormal or we need to change your treatment, we will call you to review the results.   Testing/Procedures: None   Follow-Up: At [facility], you and your health needs are our priority.  As part of our continuing mission to provide you with exceptional heart care, we have created designated Provider Care Teams.  These Care Teams include your primary Cardiologist (physician) and Advanced Practice Providers (APPs -  Physician Assistants and Nurse Practitioners) who all work together to provide you with the care you need, when you need it.  We recommend signing up for the patient portal called "MyChart".  Sign up information is provided on this After Visit Summary.  MyChart is used to connect with patients for Virtual Visits (Telemedicine).  Patients are able to view lab/test results, encounter notes, upcoming appointments, etc.  Non-urgent messages can be sent to your provider as well.   To learn more about what you can do with MyChart, go to [email or web address].    Your next appointment:   12 month(s)  The format for your next appointment:   In Person  Provider:   Everette Rank, MD   Other Instructions None

## 2020-07-17 DIAGNOSIS — L089 Local infection of the skin and subcutaneous tissue, unspecified: Secondary | ICD-10-CM | POA: Diagnosis not present

## 2020-07-24 DIAGNOSIS — H0102A Squamous blepharitis right eye, upper and lower eyelids: Secondary | ICD-10-CM | POA: Diagnosis not present

## 2020-07-24 DIAGNOSIS — B0239 Other herpes zoster eye disease: Secondary | ICD-10-CM | POA: Diagnosis not present

## 2020-07-24 DIAGNOSIS — H0102B Squamous blepharitis left eye, upper and lower eyelids: Secondary | ICD-10-CM | POA: Diagnosis not present

## 2020-07-24 DIAGNOSIS — H18593 Other hereditary corneal dystrophies, bilateral: Secondary | ICD-10-CM | POA: Diagnosis not present

## 2020-07-24 DIAGNOSIS — H16223 Keratoconjunctivitis sicca, not specified as Sjogren's, bilateral: Secondary | ICD-10-CM | POA: Diagnosis not present

## 2020-07-24 DIAGNOSIS — Z961 Presence of intraocular lens: Secondary | ICD-10-CM | POA: Diagnosis not present

## 2020-07-26 DIAGNOSIS — R21 Rash and other nonspecific skin eruption: Secondary | ICD-10-CM | POA: Diagnosis not present

## 2020-07-31 DIAGNOSIS — L089 Local infection of the skin and subcutaneous tissue, unspecified: Secondary | ICD-10-CM | POA: Diagnosis not present

## 2020-08-14 DIAGNOSIS — L089 Local infection of the skin and subcutaneous tissue, unspecified: Secondary | ICD-10-CM | POA: Diagnosis not present

## 2020-08-28 ENCOUNTER — Other Ambulatory Visit: Payer: Self-pay | Admitting: Interventional Cardiology

## 2020-08-28 NOTE — Telephone Encounter (Signed)
Pt would like a call as soon as her medication is sent to her pharmacy. Pt is upset because this has not been taken care of. Please address

## 2020-08-28 NOTE — Telephone Encounter (Signed)
Pt last saw Dr Eldridge Dace 07/13/20, last labs 07/12/20 Creat 0.85, age 86, weight 73.8kg, CrCl 57.4, based on CrCl pt is on appropriate dosage of Xarelto 20mg  QD.  Will refill rx.  Called pt advised rx has been sent to pharmacy as requested.  Pt is upset stating she took her last dosage of Xarelto last night and called in this refill at 9:30am this morning.  Advised pt we did not receive refill request through the escripts interface until 3:10pm this afternoon from [facility].  And the rx was refilled at 3:28pm, therefore there was not a delay on our part.  Advised in the future pt needs to allow 24-48 hours in the future for refill requests. Gave pt the direct number to the  Anticoagulation clinic to call if in the future after she has called her pharmacy FIRST to request a refill and is concerned about a delay in the refill process of her Xarelto prescription she can call and we can check on the status of her refill request for Xarelto ONLY.   Pt then wanted to speak with , Dr [place] nurse.  She is upset with the difficult process of getting in touch with anyone in his office or any of his support staff.  Pt states she only ever gets the automated prompts on the [phone] line and never gets to speak with an actual person.  She would like detailed instructions on how to navigate the phone system to get to speak to Dr [phone] nurse/support staff. Will forward message to Hoyle Barr to address. Please call pt back to advise. Thanks

## 2020-08-29 NOTE — Telephone Encounter (Signed)
Called and spoke to patient earlier. Patient was upset that she did not get her refill for Xarelto in a timely manner. She states that she called her pharmacy requesting a refill at 9:30 AM yesterday. Our office did not receive her request until 3:10 PM yesterday and her refill was sent in by our Anticoagulation RN at 3:28 PM. She states that her pharmacy delivers to [facility] where she stays once a day and by the time that the refill was sent in it was past the delivery time and she was not able to take her dose yesterday. I apologized for the delay and asked that in the future to try and request a refill at least 24-48 hours in advance of her last dose.   Patient asked how many refills were on the refill that was sent in yesterday. Made her aware that they sent in a 90 day supply with 1 refill (for a total of 6 months supply). Patient was asking why was it not sent in for a year supply. Made patient aware that with Xarelto it is standard of care for a CBC and BMET to be checked q26months to ensure that she is on the appropriate dose and screen for drops in H/H which could indicate bleeding and after verification of current labs the anticoagulation clinic will send in another 6 months supply of the medicine. Patient states that she has never had labs checked q32months and that she wants to know why if it is the standard of care why this has not been done for the past 10 years that she has been taking the medicine. Patient is requesting that I speak with Dr. Eldridge Dace and the anticoagulation clinic to find out why this has either recently changed or why she has not been receiving the standard of care. Made patient aware that I will discuss this with both parties and call her back by the end of the week.   Patient also expressed frustration with the phone system that our office uses. She states that you get numerous automated prompts and that you have to wait a very long time before getting to speak to an actual  person and then have to wait for a call back. I apologized for the wait times and made the patient aware that this is something that the team is working on improving and that I will pass along this feedback. Made patient aware that since she is already set up for MyChart that she could always send a MyChart message instead of having to wait so long on the phone. The patient states that she does not like MyChart and will not use it.

## 2020-09-01 DIAGNOSIS — H18593 Other hereditary corneal dystrophies, bilateral: Secondary | ICD-10-CM | POA: Diagnosis not present

## 2020-09-01 DIAGNOSIS — H0101B Ulcerative blepharitis left eye, upper and lower eyelids: Secondary | ICD-10-CM | POA: Diagnosis not present

## 2020-09-01 DIAGNOSIS — H04123 Dry eye syndrome of bilateral lacrimal glands: Secondary | ICD-10-CM | POA: Diagnosis not present

## 2020-09-01 DIAGNOSIS — H0101A Ulcerative blepharitis right eye, upper and lower eyelids: Secondary | ICD-10-CM | POA: Diagnosis not present

## 2020-09-01 MED ORDER — RIVAROXABAN 20 MG PO TABS: 20.0000 mg | ORAL_TABLET | Freq: Every day | ORAL | 3 refills | Status: DC

## 2020-09-01 NOTE — Telephone Encounter (Signed)
Called and spoke to the patient. Made her aware that I spoke to Dr. Eldridge Dace and Laural Golden, PharmD regarding labs and refilling Xarelto. Per Dr. Eldridge Dace and PharmD we will only check labs once a year and send in refills for a year when refilling her Xarelto. I have updated her Rx at [facility].

## 2020-09-22 DIAGNOSIS — R300 Dysuria: Secondary | ICD-10-CM | POA: Diagnosis not present

## 2020-09-22 DIAGNOSIS — R350 Frequency of micturition: Secondary | ICD-10-CM | POA: Diagnosis not present

## 2020-09-22 DIAGNOSIS — R3589 Other polyuria: Secondary | ICD-10-CM | POA: Diagnosis not present

## 2020-10-05 DIAGNOSIS — R21 Rash and other nonspecific skin eruption: Secondary | ICD-10-CM | POA: Diagnosis not present

## 2020-10-31 DIAGNOSIS — L720 Epidermal cyst: Secondary | ICD-10-CM | POA: Diagnosis not present

## 2020-11-29 ENCOUNTER — Other Ambulatory Visit: Payer: Self-pay | Admitting: Interventional Cardiology

## 2020-12-07 DIAGNOSIS — H18593 Other hereditary corneal dystrophies, bilateral: Secondary | ICD-10-CM | POA: Diagnosis not present

## 2021-01-19 DIAGNOSIS — R319 Hematuria, unspecified: Secondary | ICD-10-CM | POA: Diagnosis not present

## 2021-02-28 ENCOUNTER — Other Ambulatory Visit: Payer: Self-pay | Admitting: Interventional Cardiology

## 2021-02-28 ENCOUNTER — Telehealth: Payer: Self-pay | Admitting: Interventional Cardiology

## 2021-02-28 MED ORDER — METOPROLOL TARTRATE 100 MG PO TABS: 100.0000 mg | ORAL_TABLET | Freq: Two times a day (BID) | ORAL | 1 refills | Status: DC

## 2021-02-28 MED ORDER — FUROSEMIDE 20 MG PO TABS: 20.0000 mg | ORAL_TABLET | Freq: Two times a day (BID) | ORAL | 1 refills | Status: DC

## 2021-02-28 NOTE — Telephone Encounter (Signed)
I spoke with patient who is asking about lasix,metoprolol and Xarelto refills.  I told her Lasix and metoprolol refills were sent in earlier today.  Per refill note from earlier today Xarelto has refills and pharmacy reports patient just pick up refill.  Patient states medication is delivered to her facility.  She has not received yet but will check to see if it has been received in delivery area.  I will call patient back later to see if medication has been delivered.

## 2021-02-28 NOTE — Telephone Encounter (Signed)
*  STAT* If patient is at the pharmacy, call can be transferred to refill team.   1. Which medications need to be refilled? (please list name of each medication and dose if known) rivaroxaban (XARELTO) 20 MG TABS tablet metoprolol tartrate (LOPRESSOR) 100 MG tablet furosemide (LASIX) 20 MG tablet  2. Which pharmacy/location (including street and city if local pharmacy) is medication to be sent to? [facility] - [place], [place] - [address]  3. Do they need a 30 day or 90 day supply? 90

## 2021-02-28 NOTE — Telephone Encounter (Signed)
Called spoke with Pharmacist Amada Jupiter, 1 year's worth of refills was sent to pharmacy on 09/01/20.  Confirmed pt just picked up refill, but does still have plenty of refills on current rx.  Pharmacist does not know why we received refill request, will deny refill request.

## 2021-02-28 NOTE — Telephone Encounter (Signed)
Patient called to speak to the nurse because of the hassle it takes to try and get her prescription. Please advise

## 2021-02-28 NOTE — Telephone Encounter (Signed)
I spoke with patient who reports she picked up lasix, metoprolol and Xarelto from delivery area at her facility. I asked patient to contact her pharmacy a week prior to needing refills.  I told her pharmacy will then send requests to our office if refills needed.

## 2021-03-08 DIAGNOSIS — H0101A Ulcerative blepharitis right eye, upper and lower eyelids: Secondary | ICD-10-CM | POA: Diagnosis not present

## 2021-03-08 DIAGNOSIS — H0101B Ulcerative blepharitis left eye, upper and lower eyelids: Secondary | ICD-10-CM | POA: Diagnosis not present

## 2021-03-08 DIAGNOSIS — H18593 Other hereditary corneal dystrophies, bilateral: Secondary | ICD-10-CM | POA: Diagnosis not present

## 2021-03-08 DIAGNOSIS — H04123 Dry eye syndrome of bilateral lacrimal glands: Secondary | ICD-10-CM | POA: Diagnosis not present

## 2021-05-23 DIAGNOSIS — Z1389 Encounter for screening for other disorder: Secondary | ICD-10-CM | POA: Diagnosis not present

## 2021-05-23 DIAGNOSIS — I4891 Unspecified atrial fibrillation: Secondary | ICD-10-CM | POA: Diagnosis not present

## 2021-05-23 DIAGNOSIS — D6869 Other thrombophilia: Secondary | ICD-10-CM | POA: Diagnosis not present

## 2021-05-23 DIAGNOSIS — I5032 Chronic diastolic (congestive) heart failure: Secondary | ICD-10-CM | POA: Diagnosis not present

## 2021-05-23 DIAGNOSIS — E559 Vitamin D deficiency, unspecified: Secondary | ICD-10-CM | POA: Diagnosis not present

## 2021-05-23 DIAGNOSIS — Z0000 Encounter for general adult medical examination without abnormal findings: Secondary | ICD-10-CM | POA: Diagnosis not present

## 2021-05-23 DIAGNOSIS — E7800 Pure hypercholesterolemia, unspecified: Secondary | ICD-10-CM | POA: Diagnosis not present

## 2021-05-28 ENCOUNTER — Other Ambulatory Visit: Payer: Self-pay

## 2021-05-28 ENCOUNTER — Telehealth: Payer: Self-pay

## 2021-05-28 NOTE — Telephone Encounter (Signed)
[name] Diaz left VM in refills about her Lasix, Metoprolol Tartrate, and Xarelto. I saw she had refills and called her pharmacy ( [facility]) and they have all three medications set for delivery to [name] Diaz. She does need appointment. No answer when called.

## 2021-06-08 ENCOUNTER — Telehealth: Payer: Self-pay | Admitting: Interventional Cardiology

## 2021-06-08 DIAGNOSIS — I252 Old myocardial infarction: Secondary | ICD-10-CM

## 2021-06-08 DIAGNOSIS — I482 Chronic atrial fibrillation, unspecified: Secondary | ICD-10-CM

## 2021-06-08 DIAGNOSIS — Z7901 Long term (current) use of anticoagulants: Secondary | ICD-10-CM

## 2021-06-08 DIAGNOSIS — E785 Hyperlipidemia, unspecified: Secondary | ICD-10-CM

## 2021-06-08 NOTE — Telephone Encounter (Signed)
OK to order CBC, CMet and lipids prior to visit.

## 2021-06-08 NOTE — Telephone Encounter (Signed)
Patient calling to request lab work. I did not see any orders in yet, but she would also like to know if she will be able to get the lab work the day before her appointment. She wants to be sure that the results will be back in time for her appointment. She says if she does not answer to leave a detailed message letting her know if she can have the labs done 9/14 for her appointment 9/15.

## 2021-06-08 NOTE — Telephone Encounter (Signed)
Spoke with the patient and have scheduled her for lab work on 9/14. She is aware to come fasting for the lab work.

## 2021-07-04 ENCOUNTER — Other Ambulatory Visit: Payer: Medicare PPO | Admitting: *Deleted

## 2021-07-04 ENCOUNTER — Other Ambulatory Visit: Payer: Self-pay

## 2021-07-04 DIAGNOSIS — I252 Old myocardial infarction: Secondary | ICD-10-CM

## 2021-07-04 DIAGNOSIS — I482 Chronic atrial fibrillation, unspecified: Secondary | ICD-10-CM | POA: Diagnosis not present

## 2021-07-04 DIAGNOSIS — E785 Hyperlipidemia, unspecified: Secondary | ICD-10-CM

## 2021-07-04 DIAGNOSIS — Z7901 Long term (current) use of anticoagulants: Secondary | ICD-10-CM | POA: Diagnosis not present

## 2021-07-04 LAB — LIPID PANEL
Chol/HDL Ratio: 2.7 ratio (ref 0.0–4.4)
Cholesterol, Total: 199 mg/dL (ref 100–199)
HDL: 75 mg/dL (ref >39)
LDL Chol Calc (NIH): 111 mg/dL — ABNORMAL HIGH (ref 0–99)
Triglycerides: 72 mg/dL (ref 0–149)
VLDL Cholesterol Cal: 13 mg/dL (ref 5–40)

## 2021-07-04 LAB — COMPREHENSIVE METABOLIC PANEL
ALT: 15 IU/L (ref 0–32)
AST: 25 IU/L (ref 0–40)
Albumin/Globulin Ratio: 2.8 — ABNORMAL HIGH (ref 1.2–2.2)
Albumin: 4.8 g/dL — ABNORMAL HIGH (ref 3.6–4.6)
Alkaline Phosphatase: 82 IU/L (ref 44–121)
BUN/Creatinine Ratio: 19 (ref 12–28)
BUN: 15 mg/dL (ref 8–27)
Bilirubin Total: 0.7 mg/dL (ref 0.0–1.2)
CO2: 27 mmol/L (ref 20–29)
Calcium: 9.9 mg/dL (ref 8.7–10.3)
Chloride: 99 mmol/L (ref 96–106)
Creatinine, Ser: 0.8 mg/dL (ref 0.57–1.00)
Globulin, Total: 1.7 g/dL (ref 1.5–4.5)
Glucose: 99 mg/dL (ref 65–99)
Potassium: 4.3 mmol/L (ref 3.5–5.2)
Sodium: 140 mmol/L (ref 134–144)
Total Protein: 6.5 g/dL (ref 6.0–8.5)
eGFR: 72 mL/min/{1.73_m2} (ref >59)

## 2021-07-04 LAB — CBC
Hematocrit: 41.1 % (ref 34.0–46.6)
Hemoglobin: 13.6 g/dL (ref 11.1–15.9)
MCH: 30.4 pg (ref 26.6–33.0)
MCHC: 33.1 g/dL (ref 31.5–35.7)
MCV: 92 fL (ref 79–97)
Platelets: 181 10*3/uL (ref 150–450)
RBC: 4.48 x10E6/uL (ref 3.77–5.28)
RDW: 13 % (ref 11.7–15.4)
WBC: 15.3 10*3/uL — ABNORMAL HIGH (ref 3.4–10.8)

## 2021-07-05 ENCOUNTER — Encounter: Payer: Self-pay | Admitting: Interventional Cardiology

## 2021-07-05 ENCOUNTER — Ambulatory Visit: Payer: Medicare PPO | Admitting: Interventional Cardiology

## 2021-07-05 ENCOUNTER — Telehealth: Payer: Self-pay | Admitting: Interventional Cardiology

## 2021-07-05 VITALS — BP 124/72 | HR 91 | Ht 67.0 in | Wt 157.8 lb

## 2021-07-05 DIAGNOSIS — I482 Chronic atrial fibrillation, unspecified: Secondary | ICD-10-CM | POA: Diagnosis not present

## 2021-07-05 DIAGNOSIS — Z7901 Long term (current) use of anticoagulants: Secondary | ICD-10-CM

## 2021-07-05 DIAGNOSIS — I872 Venous insufficiency (chronic) (peripheral): Secondary | ICD-10-CM

## 2021-07-05 DIAGNOSIS — I252 Old myocardial infarction: Secondary | ICD-10-CM | POA: Diagnosis not present

## 2021-07-05 NOTE — Telephone Encounter (Signed)
I spoke with patient and reviewed results.  Lab results routed to Dr Kevan Ny per patient's request.

## 2021-07-05 NOTE — Patient Instructions (Signed)
Medication Instructions:  Your physician recommends that you continue on your current medications as directed. Please refer to the Current Medication list given to you today.  *If you need a refill on your cardiac medications before your next appointment, please call your pharmacy*   Lab Work: none If you have labs (blood work) drawn today and your tests are completely normal, you will receive your results only by: . MyChart Message (if you have MyChart) OR . A paper copy in the mail If you have any lab test that is abnormal or we need to change your treatment, we will call you to review the results.   Testing/Procedures: none   Follow-Up: At [facility], you and your health needs are our priority.  As part of our continuing mission to provide you with exceptional heart care, we have created designated Provider Care Teams.  These Care Teams include your primary Cardiologist (physician) and Advanced Practice Providers (APPs -  Physician Assistants and Nurse Practitioners) who all work together to provide you with the care you need, when you need it.  We recommend signing up for the patient portal called "MyChart".  Sign up information is provided on this After Visit Summary.  MyChart is used to connect with patients for Virtual Visits (Telemedicine).  Patients are able to view lab/test results, encounter notes, upcoming appointments, etc.  Non-urgent messages can be sent to your provider as well.   To learn more about what you can do with MyChart, go to [email or web address].    Your next appointment:   12 month(s)  The format for your next appointment:   In Person  Provider:   You may see Dr Varanasi or one of the following Advanced Practice Providers on your designated Care Team:    Dayna Dunn, PA-C  Michele Lenze, PA-C    Other Instructions   

## 2021-07-05 NOTE — Progress Notes (Signed)
Cardiology Office Note   Date:  07/05/2021   ID:  [name] Diaz, DOB 08/05/1934, MRN [id number]  PCP:  Marden Noble, MD    No chief complaint on file.  AFib  Wt Readings from Last 3 Encounters:  07/05/21 157 lb 12.8 oz (71.6 kg)  07/13/20 162 lb 12.8 oz (73.8 kg)  07/21/19 158 lb (71.7 kg)       History of Present Illness: [name] Diaz is a 87 y.o. female   who has had AFib and MI. She has had a stroke secondary to atrial fibrillation in the past. She had a DES to LAD in 2011.   MI was thought to be due to a possible left atrial appendage embolism down the coronary.    In the past,  She has several rashes which may be related to Provera and Xarelto. THis has improved. She follows with dermatology.   She had coils placed in veins in her vulva due to bleeding in Jan 2016.  This was done at [facility].   [facility] moved to [place] in September 2019.     She stopped statins due to rash.  SHe did not want PCSK-9 inhibitor.    She continues to stay at [facility].  She thinks she can improve her diet.    Denies : Chest pain. Dizziness. Leg edema. Nitroglycerin use. Orthopnea. Palpitations. Paroxysmal nocturnal dyspnea. Shortness of breath. Syncope.    She is getting back to exercise over the past month.     Past Medical History:  Diagnosis Date   A-fib [facility])    Colonic diverticular disease    Dysrhythmia    Atrial fib   History of cataract surgery    with lens implant    History of colon polyps    Myocardial infarction [facility]) 2011   DES TO LAD   Shortness of breath    on exertion    Past Surgical History:  Procedure Laterality Date   EYE SURGERY     Bilateral Cataract surgery w/ lens implant     HYSTEROSCOPY WITH D & C  08/25/2012   Procedure: DILATATION AND CURETTAGE /HYSTEROSCOPY;  Surgeon: Geryl Rankins, MD;  Location: [facility];  Service: Gynecology;  Laterality: N/A;  TruClear   VEIN LIGATION  1960s     Current Outpatient Medications  Medication Sig  Dispense Refill   Calcium Carbonate-Vitamin D 600-400 MG-UNIT tablet Take 1 tablet by mouth daily.      diazepam (VALIUM) 5 MG tablet Take 5 mg by mouth at bedtime as needed. For anxiety     fish oil-omega-3 fatty acids 1000 MG capsule Take 3 g by mouth daily.     furosemide (LASIX) 20 MG tablet Take 1 tablet (20 mg total) by mouth 2 (two) times daily. Please make yearly appt with Dr. Eldridge Dace for September 2022 for future refills. Thank you 1st attempt 180 tablet 1   metoprolol tartrate (LOPRESSOR) 100 MG tablet Take 1 tablet (100 mg total) by mouth 2 (two) times daily. Please make yearly appt with Dr. Eldridge Dace for September 2022 for future refills. Thank you 1st attempt 180 tablet 1   Multiple Vitamin (MULTIVITAMIN WITH MINERALS) TABS Take 1 tablet by mouth daily.     nitroGLYCERIN (NITROSTAT) 0.4 MG SL tablet Place 1 tablet (0.4 mg total) under the tongue every 5 (five) minutes as needed for chest pain. 25 tablet 5   rivaroxaban (XARELTO) 20 MG TABS tablet Take 1 tablet (20 mg total) by  mouth daily with supper. 90 tablet 3   No current facility-administered medications for this visit.    Allergies:   Clindamycin/lincomycin, Pollen extract, and Simvastatin    Social History:  The patient  reports that she has quit smoking. Her smoking use included cigarettes. She has never used smokeless tobacco. She reports that she does not drink alcohol and does not use drugs.   Family History:  The patient's family history includes CVA in her mother; Heart attack in her brother.    ROS:  Please see the history of present illness.   Otherwise, review of systems are positive for being irritated with the people in her exercise class, so she stopped exercising for a week.   All other systems are reviewed and negative.    PHYSICAL EXAM: VS:  BP 124/72   Pulse 91   Ht 5\' 7"  (1.702 m)   Wt 157 lb 12.8 oz (71.6 kg)   SpO2 97%   BMI 24.71 kg/m  , BMI Body mass index is 24.71 kg/m. GEN: Well nourished,  well developed, in no acute distress HEENT: normal Neck: no JVD, carotid bruits, or masses Cardiac: irregularly irregular; no murmurs, rubs, or gallops,no edema  Respiratory:  clear to auscultation bilaterally, normal work of breathing GI: soft, nontender, nondistended, + BS MS: no deformity or atrophy Skin: warm and dry, no rash Neuro:  Strength and sensation are intact Psych: euthymic mood, full affect   EKG:   The ekg ordered today demonstrates AFib, rate controlled, PVCs   Recent Labs: 07/04/2021: ALT 15; BUN 15; Creatinine, Ser 0.80; Hemoglobin 13.6; Platelets 181; Potassium 4.3; Sodium 140   Lipid Panel    Component Value Date/Time   CHOL 199 07/04/2021 1106   TRIG 72 07/04/2021 1106   HDL 75 07/04/2021 1106   CHOLHDL 2.7 07/04/2021 1106   CHOLHDL 1.7 03/25/2016 0842   VLDL 10 03/25/2016 0842   LDLCALC 111 (H) 07/04/2021 1106     Other studies Reviewed: Additional studies/ records that were reviewed today with results demonstrating: labs reviewed.     ASSESSMENT AND PLAN:  AFib: Rate controlled. Continue Xarelto for stroke prevention.  Chronic diastolic heart failure: Appears euvolemic.  CAD/Old MI: No angina.  No CHF sx.  Venous insufficiency: Elevate legs.  Anticoagulated: No bleeding isues.  Hyperlipidemia: LDL 118 but HDL 80 in 06/2020. Intolerant of statin.  Etiology of her MI was most likely embolic. Labs were discussed in detail with the patient.     Current medicines are reviewed at length with the patient today.  The patient concerns regarding her medicines were addressed.  The following changes have been made:  No change  Labs/ tests ordered today include:  No orders of the defined types were placed in this encounter.   Recommend 150 minutes/week of aerobic exercise Low fat, low carb, high fiber diet recommended  Disposition:   FU in 1 year   Signed, 07/2020, MD  07/05/2021 2:10 PM    [facility] [address] [place], [place], [place]  [place] Phone: [phone]; Fax: [phone]

## 2021-07-05 NOTE — Telephone Encounter (Signed)
  Pt requesting to speak with Dennie Bible,. She said she has question about her AVS and lab results

## 2021-07-11 ENCOUNTER — Telehealth: Payer: Self-pay | Admitting: Interventional Cardiology

## 2021-07-11 MED ORDER — NITROGLYCERIN 0.4 MG SL SUBL: 0.4000 mg | SUBLINGUAL_TABLET | SUBLINGUAL | 1 refills | Status: AC | PRN

## 2021-07-11 MED ORDER — NITROGLYCERIN 0.4 MG SL SUBL: 0.4000 mg | SUBLINGUAL_TABLET | SUBLINGUAL | 5 refills | Status: DC | PRN

## 2021-07-11 NOTE — Telephone Encounter (Signed)
Pt's medication was sent to pt's pharmacy as requested. Confirmation received.  °

## 2021-07-11 NOTE — Telephone Encounter (Signed)
*  STAT* If patient is at the pharmacy, call can be transferred to refill team.   1. Which medications need to be refilled? (please list name of each medication and dose if known) nitroGLYCERIN (NITROSTAT) 0.4 MG SL tablet  2. Which pharmacy/location (including street and city if local pharmacy) is medication to be sent to? [facility] - [place], [place] - [address]  3. Do they need a 30 day or 90 day supply? 90 day supply

## 2021-07-11 NOTE — Telephone Encounter (Signed)
Spoke with the pt, and she is requesting a refill of her nitro. Pt states she never uses this, and last time used was over 5 years ago. Pt is asking when nitro expires. Informed the pt that with nitro, if tablets kept in a small, amber, tightly closed glass bottle and kept in a refrigerator, it can maintain its potency for 3-5 months if bottles are opened once a week. Informed the pt that after 5 months, the unused tablets should be discarded. With that information, pt is just requesting a month supply of nitro with refills, be sent to her confirmed pharmacy of choice.  Sent rx in for the pt.  She is aware.  Pt verbalized understanding and agrees with this plan.  Pt was more than gracious for all the assistance provided.

## 2021-07-11 NOTE — Telephone Encounter (Signed)
Pt called this morning to order her Nitro script for a 90 day supply, pt wants to know if after 90 days does that mean the medicine is expired. Please call pt on her land line phone and if she does not answer please leave a message

## 2021-07-23 DIAGNOSIS — Z23 Encounter for immunization: Secondary | ICD-10-CM | POA: Diagnosis not present

## 2021-09-21 ENCOUNTER — Other Ambulatory Visit: Payer: Self-pay

## 2021-09-21 ENCOUNTER — Emergency Department (HOSPITAL_COMMUNITY)
Admission: EM | Admit: 2021-09-21 | Discharge: 2021-09-22 | Disposition: A | Discharge status: Home / Self Care | Payer: Medicare PPO | Attending: Emergency Medicine | Admitting: Emergency Medicine

## 2021-09-21 ENCOUNTER — Emergency Department (HOSPITAL_COMMUNITY): Payer: Medicare PPO

## 2021-09-21 ENCOUNTER — Encounter (HOSPITAL_COMMUNITY): Payer: Self-pay

## 2021-09-21 DIAGNOSIS — S0181XA Laceration without foreign body of other part of head, initial encounter: Secondary | ICD-10-CM | POA: Insufficient documentation

## 2021-09-21 DIAGNOSIS — Z7901 Long term (current) use of anticoagulants: Secondary | ICD-10-CM | POA: Diagnosis not present

## 2021-09-21 DIAGNOSIS — Z87891 Personal history of nicotine dependence: Secondary | ICD-10-CM | POA: Insufficient documentation

## 2021-09-21 DIAGNOSIS — M47812 Spondylosis without myelopathy or radiculopathy, cervical region: Secondary | ICD-10-CM | POA: Diagnosis not present

## 2021-09-21 DIAGNOSIS — Z23 Encounter for immunization: Secondary | ICD-10-CM | POA: Diagnosis not present

## 2021-09-21 DIAGNOSIS — I5032 Chronic diastolic (congestive) heart failure: Secondary | ICD-10-CM | POA: Insufficient documentation

## 2021-09-21 DIAGNOSIS — M25561 Pain in right knee: Secondary | ICD-10-CM | POA: Insufficient documentation

## 2021-09-21 DIAGNOSIS — S0003XA Contusion of scalp, initial encounter: Secondary | ICD-10-CM | POA: Diagnosis not present

## 2021-09-21 DIAGNOSIS — W01198A Fall on same level from slipping, tripping and stumbling with subsequent striking against other object, initial encounter: Secondary | ICD-10-CM | POA: Diagnosis not present

## 2021-09-21 DIAGNOSIS — M5030 Other cervical disc degeneration, unspecified cervical region: Secondary | ICD-10-CM | POA: Diagnosis not present

## 2021-09-21 DIAGNOSIS — Z79899 Other long term (current) drug therapy: Secondary | ICD-10-CM | POA: Insufficient documentation

## 2021-09-21 DIAGNOSIS — W19XXXA Unspecified fall, initial encounter: Secondary | ICD-10-CM

## 2021-09-21 DIAGNOSIS — Z043 Encounter for examination and observation following other accident: Secondary | ICD-10-CM | POA: Diagnosis not present

## 2021-09-21 DIAGNOSIS — I4891 Unspecified atrial fibrillation: Secondary | ICD-10-CM | POA: Diagnosis not present

## 2021-09-21 DIAGNOSIS — R0602 Shortness of breath: Secondary | ICD-10-CM | POA: Diagnosis not present

## 2021-09-21 DIAGNOSIS — R58 Hemorrhage, not elsewhere classified: Secondary | ICD-10-CM | POA: Diagnosis not present

## 2021-09-21 DIAGNOSIS — S0990XA Unspecified injury of head, initial encounter: Secondary | ICD-10-CM | POA: Diagnosis not present

## 2021-09-21 MED ORDER — METOPROLOL TARTRATE 25 MG PO TABS
100.0000 mg | ORAL_TABLET | Freq: Once | ORAL | Status: AC
Start: 2021-09-21 — End: 2021-09-21
  Administered 2021-09-21: 100 mg via ORAL
  Filled 2021-09-21: qty 4

## 2021-09-21 MED ORDER — TETANUS-DIPHTH-ACELL PERTUSSIS 5-2.5-18.5 LF-MCG/0.5 IM SUSY
0.5000 mL | PREFILLED_SYRINGE | Freq: Once | INTRAMUSCULAR | Status: AC
  Administered 2021-09-21: 0.5 mL via INTRAMUSCULAR
  Filled 2021-09-21: qty 0.5

## 2021-09-21 MED ORDER — DIAZEPAM 5 MG PO TABS
5.0000 mg | ORAL_TABLET | Freq: Once | ORAL | Status: AC
  Administered 2021-09-21: 5 mg via ORAL
  Filled 2021-09-21: qty 1

## 2021-09-21 MED ORDER — LIDOCAINE-EPINEPHRINE (PF) 2 %-1:200000 IJ SOLN
20.0000 mL | Freq: Once | INTRAMUSCULAR | Status: AC
  Administered 2021-09-21: 20 mL via INTRADERMAL
  Filled 2021-09-21: qty 20

## 2021-09-21 NOTE — ED Notes (Signed)
Patient transported to CT w/ TRN ?

## 2021-09-21 NOTE — ED Triage Notes (Signed)
Pt arrived via [facility] from [facility]. Per EMS pt was walking down hall and tripped and fell into corner of wall hitting head. Pt arrived w/collar on. Pt arrived w/steri strips applied to both lacs on right side of forehead. Pt has ecchymosis of right cheek w/1+ swelling to it. Pt has ecchymosis and 1+ swelling to right knee. Neurovasc intact. Pt is A&Ox4. VSS. Pt denies LOC, N/V, dizziness or vision changes. Pt on xarelto.

## 2021-09-21 NOTE — ED Notes (Signed)
Pt took the heart  monitor, bp cuff and Sa02 off and got dressed. Pt stated she does not want to be hooked up anymore and that she just wants to go home.

## 2021-09-21 NOTE — Discharge Instructions (Signed)
You have been seen and discharged from the emergency department.  Your CT and x-ray imaging were negative.  Your 2 small lacerations on the right forehead were repaired with sutures.  In 24 hours you may shower, keep the area clean and dry.  Follow-up with your primary doctor for suture removal in 7 days.  Follow-up with your primary provider for reevaluation and further care. Take home medications as prescribed. If you have any worsening symptoms or further concerns for your health please return to an emergency department for further evaluation.

## 2021-09-21 NOTE — ED Notes (Signed)
Trauma Response Nurse Note-  Reason for Call / Reason for Trauma activation:   - L2 fall on Xarelto   Initial Focused Assessment (If applicable, or please see trauma documentation):  - Pt's GCS 14-15 (with intermittent confusion) - VS WDL - C-Collar in place - Lac to R eyebrow and small lac/abrasion to R forehead at hairline - Bruising to R cheek. - Steristrips to right forehead applied by EMS  - Swelling/bruising to R knee.  Interventions:  - CT head and neck - XR to right knee  Plan of Care as of this note:  - Awaiting scan results  Event Summary:   - Pt is from [facility].  Pt EMS, pt was walking down the hall and tripped and fell into the corner of the wall and hit her head.  Pt was brought here as a L2 due to taking xarelto.  Pt is having some intermittent confusion which is unclear at this time if this is her baseline.  She stated that the MD never came in the room and she also reported to me that she was walking out of a [employer] when this happened.  Dr. Wilkie Aye was in the room with the pt on pt's arrival and appropriately ordered everything for the pt.  [place], [place] [phone]

## 2021-09-21 NOTE — ED Notes (Signed)
Pt has 10 people infront of her to [place] transport

## 2021-09-21 NOTE — ED Provider Notes (Addendum)
[name] Diaz   CSN: [id number] Arrival date & time: 09/21/21  1129     History Chief Complaint  Patient presents with   [name] Diaz is a 87 y.o. female.  HPI  87 year old female with past medical history of A. fib anticoagulated on Xarelto, CHF, HLD presents emergency department with mechanical fall and head injury.  Seen as a level 2 trauma due to fall on blood thinners.  She was walking, lost her balance and fell down hitting the right side of her head.  She did not catch herself with her arms.  No loss of consciousness.  Currently complaining of right-sided head discomfort, c-collar in place.  She has had some mild right knee pain but denies any other traumatic injury.  No damage to her dentition or mouth/tongue.  She denies any syncope.  Otherwise states she is in her usual state of health.  Past Medical History:  Diagnosis Date   A-fib [facility])    Colonic diverticular disease    Dysrhythmia    Atrial fib   History of cataract surgery    with lens implant    History of colon polyps    Myocardial infarction [facility]) 2011   DES TO LAD   Shortness of breath    on exertion    Patient Active Problem List   Diagnosis Date Noted   Hyperlipidemia 02/26/2018   Anticoagulated 03/30/2015   Bleeding from varicose veins of lower extremity 10/24/2014   Chronic diastolic heart failure (HCC) 03/29/2014   Old myocardial infarction 03/29/2014   Edema 03/29/2014   A-fib [facility])     Past Surgical History:  Procedure Laterality Date   EYE SURGERY     Bilateral Cataract surgery w/ lens implant     HYSTEROSCOPY WITH D & C  08/25/2012   Procedure: DILATATION AND CURETTAGE /HYSTEROSCOPY;  Surgeon: Geryl Rankins, MD;  Location: [facility];  Service: Gynecology;  Laterality: N/A;  T[employer]   VEIN LIGATION  1960s     OB History   No obstetric history on file.     Family History  Problem Relation Age of Onset   CVA Mother    Heart  attack Brother    Breast cancer Neg Hx     Social History   Tobacco Use   Smoking status: Former    Years: 40.00    Types: Cigarettes   Smokeless tobacco: Never  Substance Use Topics   Alcohol use: No   Drug use: No    Home Medications Prior to Admission medications   Medication Sig Start Date End Date Taking? Authorizing Provider  Calcium Carbonate-Vitamin D 600-400 MG-UNIT tablet Take 1 tablet by mouth daily.     [provider]  diazepam (VALIUM) 5 MG tablet Take 5 mg by mouth at bedtime as needed. For anxiety    [provider]  fish oil-omega-3 fatty acids 1000 MG capsule Take 3 g by mouth daily.    [provider]  furosemide (LASIX) 20 MG tablet Take 1 tablet (20 mg total) by mouth 2 (two) times daily. Please make yearly appt with Dr. Eldridge Dace for September 2022 for future refills. Thank you 1st attempt 02/28/21   Corky Crafts, MD  metoprolol tartrate (LOPRESSOR) 100 MG tablet Take 1 tablet (100 mg total) by mouth 2 (two) times daily. Please make yearly appt with Dr. Eldridge Dace for September 2022 for future refills. Thank you 1st attempt 02/28/21   Eldridge Dace,  Donnie Coffin, MD  Multiple Vitamin (MULTIVITAMIN WITH MINERALS) TABS Take 1 tablet by mouth daily.    [provider]  nitroGLYCERIN (NITROSTAT) 0.4 MG SL tablet Place 1 tablet (0.4 mg total) under the tongue every 5 (five) minutes as needed for chest pain. 07/11/21   Corky Crafts, MD  rivaroxaban (XARELTO) 20 MG TABS tablet Take 1 tablet (20 mg total) by mouth daily with supper. 09/01/20   Corky Crafts, MD    Allergies    Clindamycin/lincomycin, Pollen extract, and Simvastatin  Review of Systems   Review of Systems  Constitutional:  Negative for chills and fever.  HENT:  Negative for congestion.   Eyes:  Negative for visual disturbance.  Respiratory:  Negative for shortness of breath.   Cardiovascular:  Negative for chest pain, palpitations and leg swelling.   Gastrointestinal:  Negative for abdominal pain, diarrhea and vomiting.  Genitourinary:  Negative for dysuria.  Musculoskeletal:  Negative for back pain and neck pain.  Skin:  Negative for rash.  Neurological:  Positive for headaches. Negative for syncope, weakness, light-headedness and numbness.   Physical Exam Updated Vital Signs BP 122/78   Pulse 87   Temp (!) 97.4 F (36.3 C) (Oral)   Resp 13   Ht 5\' 7"  (1.702 m)   Wt 71.6 kg   SpO2 96%   BMI 24.72 kg/m   Physical Exam Vitals and nursing Diaz reviewed.  Constitutional:      Appearance: Normal appearance.  HENT:     Head: Normocephalic.     Comments: Abrasion/small lacerations to the right side of the forehead with Steri-Strips in place, bleeding controlled    Right Ear: External ear normal.     Left Ear: External ear normal.     Nose: Nose normal.     Mouth/Throat:     Mouth: Mucous membranes are moist.  Eyes:     Extraocular Movements: Extraocular movements intact.     Pupils: Pupils are equal, round, and reactive to light.  Neck:     Comments: C-collar in place Cardiovascular:     Rate and Rhythm: Normal rate.  Pulmonary:     Effort: Pulmonary effort is normal. No respiratory distress.  Abdominal:     Palpations: Abdomen is soft.     Tenderness: There is no abdominal tenderness.  Musculoskeletal:        General: No deformity.     Cervical back: No tenderness.     Comments: Mild swelling and bruising to the right knee  Skin:    General: Skin is warm.  Neurological:     Mental Status: She is alert and oriented to person, place, and time. Mental status is at baseline.  Psychiatric:        Mood and Affect: Mood normal.    ED Results / Procedures / Treatments   Labs (all labs ordered are listed, but only abnormal results are displayed) Labs Reviewed - No data to display  EKG EKG Interpretation  Date/Time:  Friday September 21 2021 11:30:12 EST Ventricular Rate:  92 PR Interval:  82 QRS  Duration: 103 QT Interval:  387 QTC Calculation: 479 R Axis:   121 Text Interpretation: Unknown rhythm, irregular rate Short PR interval Lateral infarct, old afib, similar to previous Confirmed by Coralee Pesa [phone]) on 09/21/2021 1:05:47 PM  Radiology No results found.  Procedures .Critical Care Performed by: Rozelle Logan, DO Authorized by: Rozelle Logan, DO   Critical care provider statement:    Critical  care time (minutes):  30   Critical care time was exclusive of:  Separately billable procedures and treating other patients   Critical care was necessary to treat or prevent imminent or life-threatening deterioration of the following conditions:  Trauma   Critical care was time spent personally by me on the following activities:  Development of treatment plan with patient or surrogate, discussions with consultants, evaluation of patient's response to treatment, examination of patient, ordering and review of laboratory studies, ordering and review of radiographic studies, ordering and performing treatments and interventions, pulse oximetry, re-evaluation of patient's condition and review of old charts   I assumed direction of critical care for this patient from another provider in my specialty: no   .Marland KitchenLaceration Repair  Date/Time: 09/21/2021 3:17 PM Performed by: Lorelle Gibbs, DO Authorized by: Lorelle Gibbs, DO   Consent:    Consent obtained:  Verbal   Consent given by:  Patient   Risks discussed:  Infection, poor cosmetic result and poor wound healing   Alternatives discussed:  No treatment Universal protocol:    Patient identity confirmed:  Verbally with patient and arm band Anesthesia:    Anesthesia method:  Local infiltration   Local anesthetic:  Lidocaine 2% WITH epi Laceration details:    Location:  Face   Face location:  Forehead   Length (cm):  4 Pre-procedure details:    Preparation:  Patient was prepped and draped in usual sterile  fashion Exploration:    Hemostasis achieved with:  Direct pressure Treatment:    Area cleansed with:  Saline   Amount of cleaning:  Standard   Irrigation solution:  Sterile saline   Irrigation method:  Syringe Skin repair:    Repair method:  Sutures   Suture size:  5-0   Suture material:  Prolene   Suture technique:  Simple interrupted   Number of sutures:  5 Approximation:    Approximation:  Close Repair type:    Repair type:  Simple Post-procedure details:    Dressing:  Sterile dressing   Procedure completion:  Tolerated .Marland KitchenLaceration Repair  Date/Time: 09/21/2021 3:18 PM Performed by: Lorelle Gibbs, DO Authorized by: Lorelle Gibbs, DO   Consent:    Consent obtained:  Verbal   Consent given by:  Patient   Risks discussed:  Infection, poor cosmetic result and poor wound healing   Alternatives discussed:  No treatment Universal protocol:    Patient identity confirmed:  Verbally with patient and arm band Laceration details:    Location:  Face   Length (cm):  1 Exploration:    Hemostasis achieved with:  Direct pressure Treatment:    Area cleansed with:  Saline   Irrigation solution:  Sterile saline   Irrigation method:  Syringe Skin repair:    Repair method:  Sutures   Suture size:  5-0   Suture material:  Prolene   Number of sutures:  1 Approximation:    Approximation:  Close Repair type:    Repair type:  Simple Post-procedure details:    Dressing:  Open (no dressing)   Procedure completion:  Tolerated   Medications Ordered in ED Medications - No data to display  ED Course  I have reviewed the triage vital signs and the nursing notes.  Pertinent labs & imaging results that were available during my care of the patient were reviewed by me and considered in my medical decision making (see chart for details).    MDM Rules/Calculators/A&P  87 year old female presents emergency department mechanical fall and head injury, on  Xarelto.  Seen and a level 2 trauma.  Patient has 2 small lacerations to the right forehead, repaired by myself with stitches.  Tdap updated.  Patient had no loss of consciousness, complaining of mild right-sided headache.  Did have an episode of amnesia after arriving here but has since been appropriate.  CT imaging of the head and neck are unremarkable, c-collar cleared.  We are pending knee x-rays, if this is negative patient will be treated as an outpatient.  She has ambulated in the department without difficulty.   The below CT read never crossed over to the work-up tab but it was present in PACS:  CLINICAL DATA: Fall, head trauma  EXAM: CT HEAD WITHOUT CONTRAST  CT CERVICAL SPINE WITHOUT CONTRAST  TECHNIQUE: Multidetector CT imaging of the head and cervical spine was performed following the standard protocol without intravenous contrast. Multiplanar CT image reconstructions of the cervical spine were also generated.  COMPARISON: None  FINDINGS: CT HEAD FINDINGS  Brain: Generalized atrophy. Normal ventricular morphology. No midline shift or mass effect. Small vessel chronic ischemic changes of deep cerebral white matter. No intracranial hemorrhage, mass lesion, evidence of acute infarction, or extra-axial fluid collection.  Vascular: Atherosclerotic calcifications of internal carotid arteries bilaterally at skull base. No hyperdense vessels.  Skull: Intact. Small RIGHT frontal scalp hematoma.  Sinuses/Orbits: Clear  Other: N/A  CT CERVICAL SPINE FINDINGS  Alignment: Normal  Skull base and vertebrae: Osseous demineralization decreased. Skull base intact. Vertebral body heights maintained. Multilevel facet degenerative changes. Multilevel disc space narrowing and endplate spur formation. No fracture, subluxation, or bone destruction.  Soft tissues and spinal canal: Prevertebral soft tissues normal thickness. Atherosclerotic calcifications at carotid  bifurcations bilaterally. Tiny BILATERAL thyroid nodules; not clinically significant, no follow-up imaging recommended.  Disc levels: No specific abnormalities  Upper chest: Lung apices clear  Other: N/A  IMPRESSION: Atrophy with small vessel chronic ischemic changes of deep cerebral white matter.  No acute intracranial abnormalities.  Multilevel degenerative disc and facet disease changes of the cervical spine.  No acute cervical spine abnormalities.   Electronically Signed By: Lavonia Dana M.D. On: 09/21/2021 13:01   Final Clinical Impression(s) / ED Diagnoses Final diagnoses:  None    Rx / DC Orders ED Discharge Orders     None        Lorelle Gibbs, DO 09/21/21 1519    Dasani Thurlow, Alvin Critchley, DO 09/21/21 1530

## 2021-09-21 NOTE — ED Notes (Signed)
Pt has intermittent confusion. Dr Wilkie Aye was made aware.

## 2021-09-22 DIAGNOSIS — Z743 Need for continuous supervision: Secondary | ICD-10-CM | POA: Diagnosis not present

## 2021-09-22 DIAGNOSIS — R531 Weakness: Secondary | ICD-10-CM | POA: Diagnosis not present

## 2021-09-22 NOTE — ED Notes (Signed)
RN reviewed discharge instructions with pt and PTAR. Both verbalized understanding and had no further questions. VSS upon discharge  

## 2021-10-05 DIAGNOSIS — W19XXXA Unspecified fall, initial encounter: Secondary | ICD-10-CM | POA: Diagnosis not present

## 2021-10-05 DIAGNOSIS — S0101XA Laceration without foreign body of scalp, initial encounter: Secondary | ICD-10-CM | POA: Diagnosis not present

## 2021-10-10 DIAGNOSIS — W19XXXS Unspecified fall, sequela: Secondary | ICD-10-CM | POA: Diagnosis not present

## 2021-10-10 DIAGNOSIS — S0181XS Laceration without foreign body of other part of head, sequela: Secondary | ICD-10-CM | POA: Diagnosis not present

## 2021-10-16 DIAGNOSIS — T485X5A Adverse effect of other anti-common-cold drugs, initial encounter: Secondary | ICD-10-CM | POA: Diagnosis not present

## 2021-10-16 DIAGNOSIS — Z20822 Contact with and (suspected) exposure to covid-19: Secondary | ICD-10-CM | POA: Diagnosis not present

## 2021-10-16 DIAGNOSIS — R0981 Nasal congestion: Secondary | ICD-10-CM | POA: Diagnosis not present

## 2021-10-19 DIAGNOSIS — I2510 Atherosclerotic heart disease of native coronary artery without angina pectoris: Secondary | ICD-10-CM

## 2021-10-19 DIAGNOSIS — D6859 Other primary thrombophilia: Secondary | ICD-10-CM | POA: Insufficient documentation

## 2021-10-19 DIAGNOSIS — J40 Bronchitis, not specified as acute or chronic: Secondary | ICD-10-CM | POA: Diagnosis not present

## 2021-10-19 DIAGNOSIS — N6019 Diffuse cystic mastopathy of unspecified breast: Secondary | ICD-10-CM

## 2021-10-19 DIAGNOSIS — E739 Lactose intolerance, unspecified: Secondary | ICD-10-CM

## 2021-10-19 DIAGNOSIS — I252 Old myocardial infarction: Secondary | ICD-10-CM | POA: Insufficient documentation

## 2021-10-19 DIAGNOSIS — E559 Vitamin D deficiency, unspecified: Secondary | ICD-10-CM

## 2021-10-19 DIAGNOSIS — D72829 Elevated white blood cell count, unspecified: Secondary | ICD-10-CM | POA: Insufficient documentation

## 2021-10-19 HISTORY — DX: Other primary thrombophilia: D68.59

## 2021-10-19 HISTORY — DX: Atherosclerotic heart disease of native coronary artery without angina pectoris: I25.10

## 2021-10-19 HISTORY — DX: Diffuse cystic mastopathy of unspecified breast: N60.19

## 2021-10-19 HISTORY — DX: Vitamin D deficiency, unspecified: E55.9

## 2021-10-19 HISTORY — DX: Old myocardial infarction: I25.2

## 2021-10-19 HISTORY — DX: Lactose intolerance, unspecified: E73.9

## 2021-10-19 HISTORY — DX: Elevated white blood cell count, unspecified: D72.829

## 2021-10-24 DIAGNOSIS — R059 Cough, unspecified: Secondary | ICD-10-CM | POA: Diagnosis not present

## 2021-10-24 DIAGNOSIS — D72820 Lymphocytosis (symptomatic): Secondary | ICD-10-CM | POA: Diagnosis not present

## 2021-10-24 DIAGNOSIS — R0602 Shortness of breath: Secondary | ICD-10-CM | POA: Diagnosis not present

## 2021-10-24 DIAGNOSIS — J90 Pleural effusion, not elsewhere classified: Secondary | ICD-10-CM | POA: Diagnosis not present

## 2021-10-24 DIAGNOSIS — R918 Other nonspecific abnormal finding of lung field: Secondary | ICD-10-CM | POA: Diagnosis not present

## 2021-10-24 DIAGNOSIS — R051 Acute cough: Secondary | ICD-10-CM | POA: Diagnosis not present

## 2021-11-28 ENCOUNTER — Other Ambulatory Visit: Payer: Self-pay | Admitting: Interventional Cardiology

## 2021-11-28 MED ORDER — RIVAROXABAN 20 MG PO TABS: 20.0000 mg | ORAL_TABLET | Freq: Every day | ORAL | 3 refills | Status: DC

## 2021-11-28 NOTE — Telephone Encounter (Signed)
Prescription refill request for Xarelto received.  Indication: Afib  Last office visit:07/05/21 Eldridge Dace)  Weight: 71.6kg Age: 88 Scr: 0.77 (10/24/21)  CrCl: 60.65ml/min  Appropriate dose and refill sent to requested pharmacy.

## 2021-12-10 DIAGNOSIS — R3989 Other symptoms and signs involving the genitourinary system: Secondary | ICD-10-CM | POA: Diagnosis not present

## 2021-12-27 DIAGNOSIS — H04123 Dry eye syndrome of bilateral lacrimal glands: Secondary | ICD-10-CM | POA: Diagnosis not present

## 2021-12-27 DIAGNOSIS — H18593 Other hereditary corneal dystrophies, bilateral: Secondary | ICD-10-CM | POA: Diagnosis not present

## 2021-12-27 DIAGNOSIS — H0101A Ulcerative blepharitis right eye, upper and lower eyelids: Secondary | ICD-10-CM | POA: Diagnosis not present

## 2022-01-22 DIAGNOSIS — F411 Generalized anxiety disorder: Secondary | ICD-10-CM | POA: Diagnosis not present

## 2022-01-22 DIAGNOSIS — G4700 Insomnia, unspecified: Secondary | ICD-10-CM | POA: Diagnosis not present

## 2022-01-29 IMAGING — CR DG KNEE COMPLETE 4+V*R*
4 series · 4 of 4 positions shown · non-contrast
Comparison: None.

CLINICAL DATA: Right knee pain after fall.

EXAM:
RIGHT KNEE - COMPLETE 4+ VIEW

[knee ap]
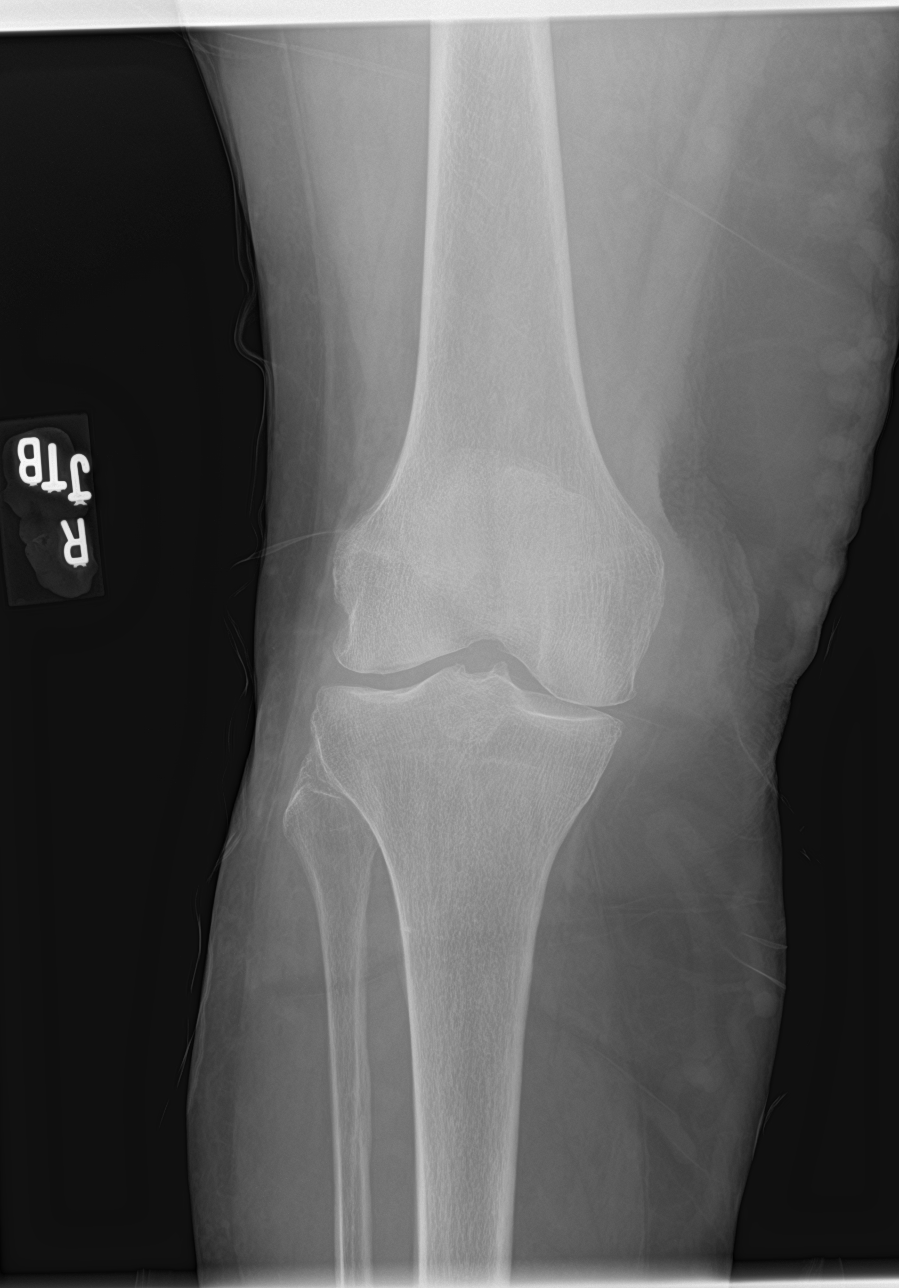

[knee lat]
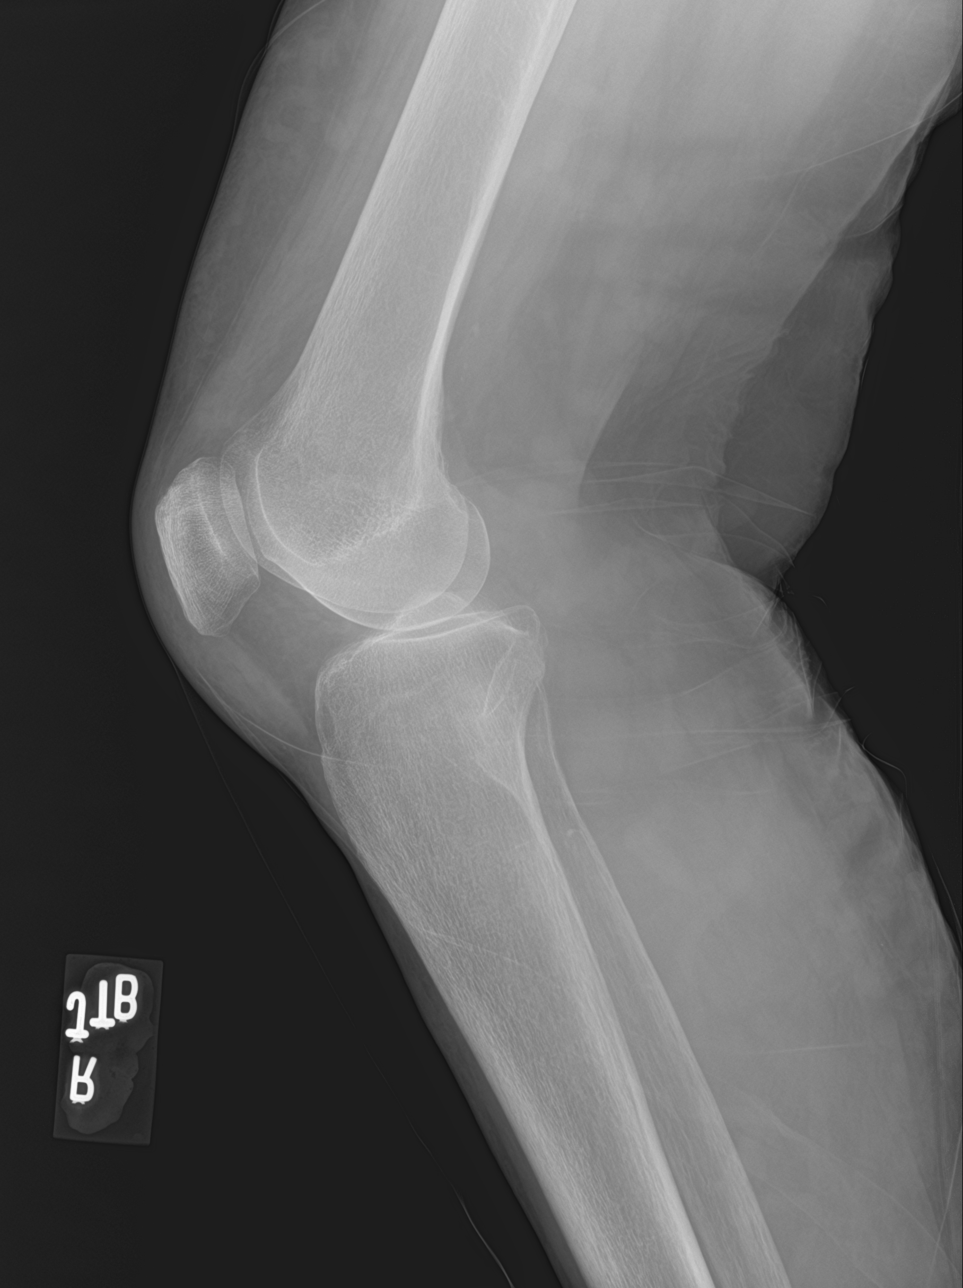

[knee obl (1 of 2)]
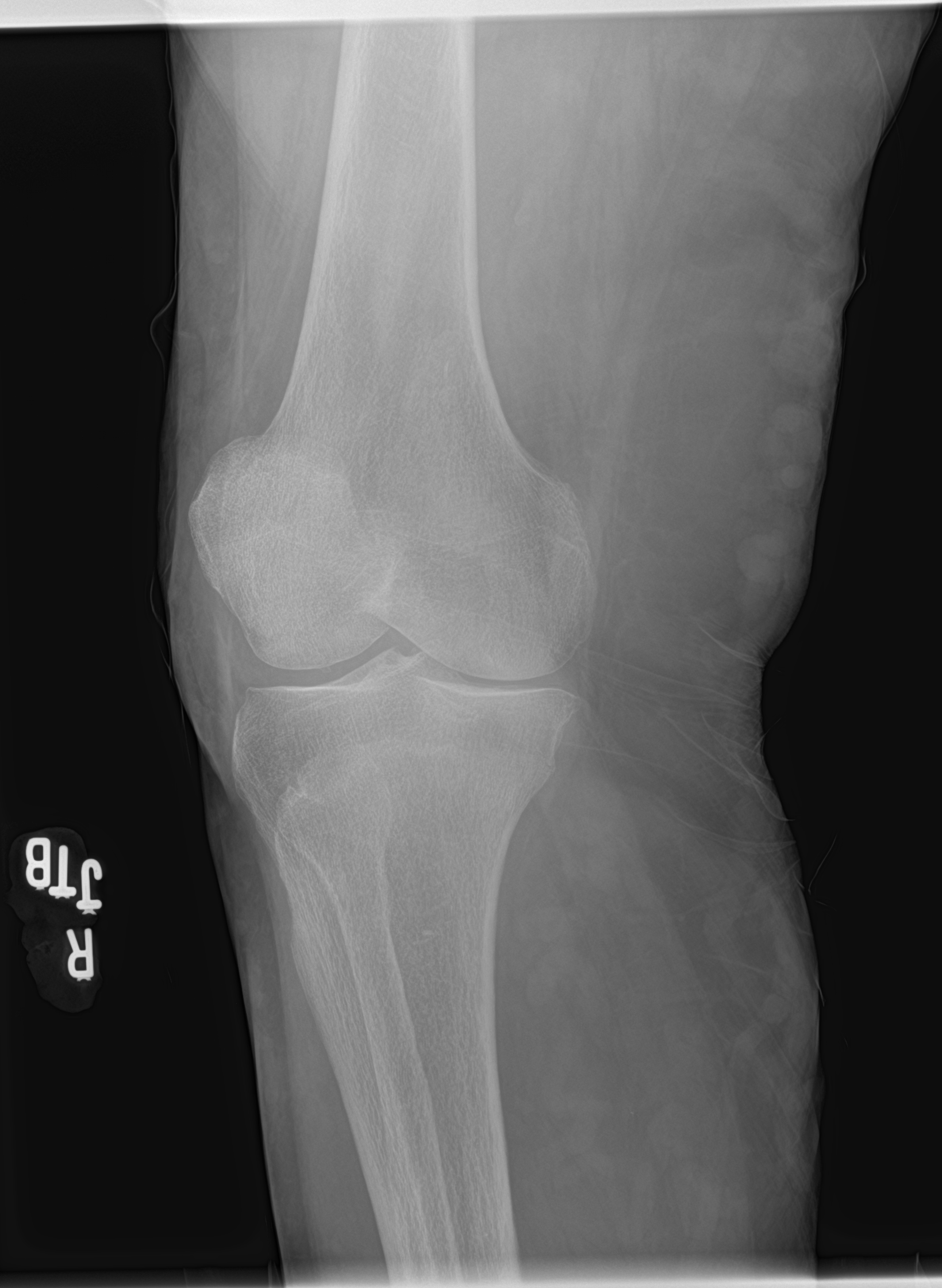

[knee obl (2 of 2)]
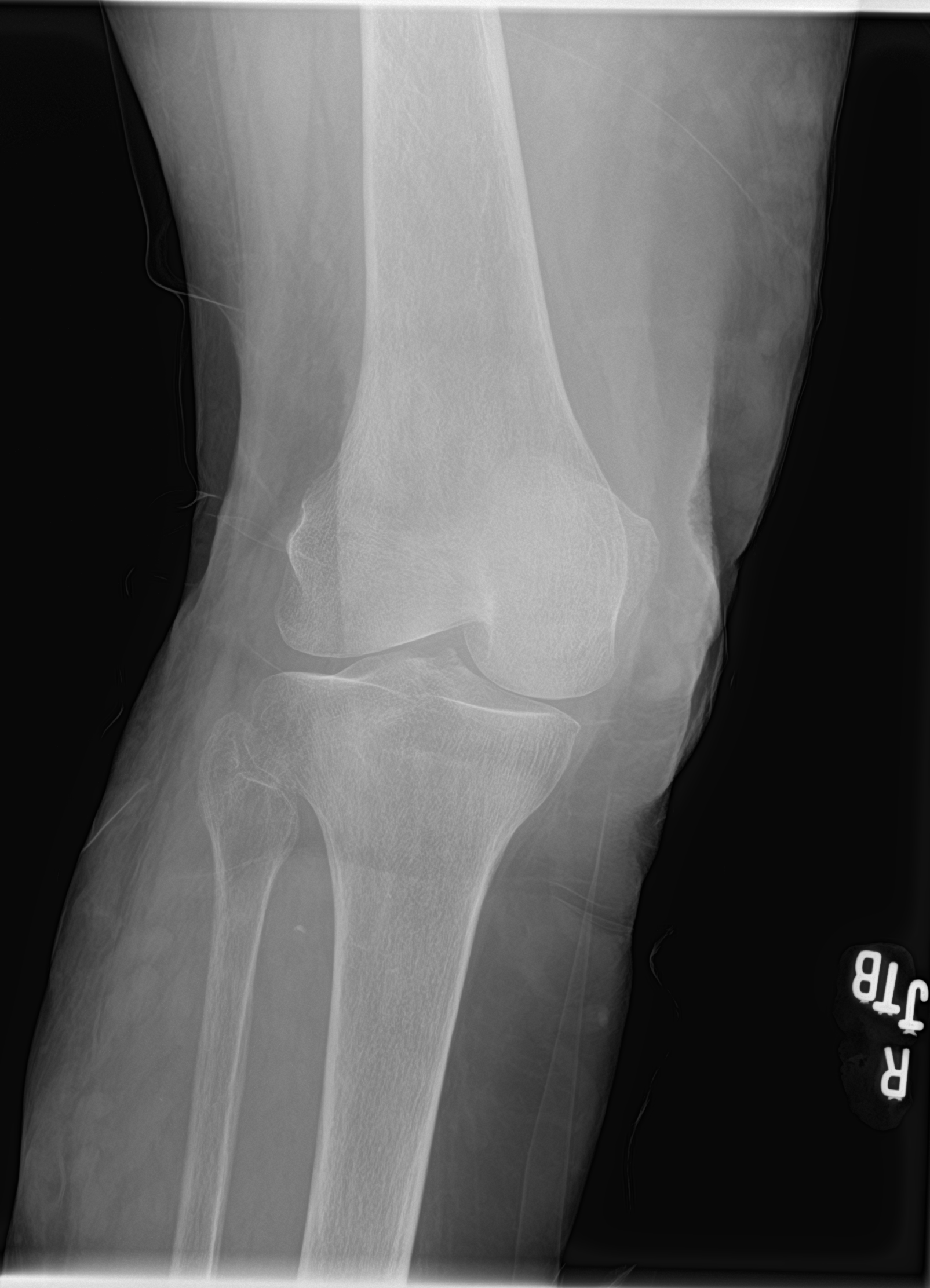

[4 of 4 positions shown; findings below may reference images not displayed]

FINDINGS: No evidence of fracture, dislocation, or joint effusion. Mild
narrowing of medial joint space is noted. Soft tissues are
unremarkable.
IMPRESSION: Mild degenerative joint disease is noted medially. No acute
abnormality seen.

## 2022-02-21 ENCOUNTER — Telehealth: Payer: Self-pay

## 2022-02-21 ENCOUNTER — Telehealth: Payer: Self-pay | Admitting: Interventional Cardiology

## 2022-02-21 NOTE — Telephone Encounter (Signed)
See other note ./cy 

## 2022-02-21 NOTE — Telephone Encounter (Signed)
Pt very difficult on phone and is only taking Furosemide 20 mg 1 a day and wanted to let Dr Irish Lack know so directions may be changed on script Will forward to Dr Irish Lack for review .Per pt taking med twice a day caused back pain kidney area /cy ?

## 2022-02-21 NOTE — Telephone Encounter (Signed)
?  Pt c/o medication issue: ? ?1. Name of Medication:  ? ?furosemide (LASIX) 20 MG tablet ? ?2. How are you currently taking this medication (dosage and times per day)? 1 pill once daily ? ?3. Are you having a reaction (difficulty breathing--STAT)?  NA ? ?4. What is your medication issue?  Patient is upset that her prescription is being written for 180 tabs and she feels she is wasting money because she is only taking 90 pills. ?

## 2022-02-21 NOTE — Telephone Encounter (Signed)
Pt calling requesting that Dr. Irish Lack change medication furosemide 20 mg tablet to pt taking 1 time daily, instead of BID. Would Dr. Irish Lack like to change this medication directions? Please address ?

## 2022-02-24 NOTE — Telephone Encounter (Signed)
OK to take Lasix as she has been. ?

## 2022-02-25 MED ORDER — FUROSEMIDE 20 MG PO TABS: 20.0000 mg | ORAL_TABLET | Freq: Every day | ORAL | 3 refills | Status: DC

## 2022-02-25 NOTE — Telephone Encounter (Signed)
Patient notified.  She takes furosemide 20 mg daily and will occasionally take one extra tablet daily as needed. New prescription sent to [facility] ?

## 2022-02-26 DIAGNOSIS — R0600 Dyspnea, unspecified: Secondary | ICD-10-CM | POA: Diagnosis not present

## 2022-02-26 DIAGNOSIS — I700 Atherosclerosis of aorta: Secondary | ICD-10-CM | POA: Diagnosis not present

## 2022-02-26 DIAGNOSIS — Z9109 Other allergy status, other than to drugs and biological substances: Secondary | ICD-10-CM | POA: Insufficient documentation

## 2022-02-26 DIAGNOSIS — R0602 Shortness of breath: Secondary | ICD-10-CM | POA: Diagnosis not present

## 2022-02-26 DIAGNOSIS — I517 Cardiomegaly: Secondary | ICD-10-CM | POA: Diagnosis not present

## 2022-02-26 DIAGNOSIS — G4700 Insomnia, unspecified: Secondary | ICD-10-CM

## 2022-02-26 HISTORY — DX: Other allergy status, other than to drugs and biological substances: Z91.09

## 2022-02-26 HISTORY — DX: Insomnia, unspecified: G47.00

## 2022-03-27 DIAGNOSIS — I252 Old myocardial infarction: Secondary | ICD-10-CM | POA: Diagnosis not present

## 2022-03-27 DIAGNOSIS — I5032 Chronic diastolic (congestive) heart failure: Secondary | ICD-10-CM | POA: Diagnosis not present

## 2022-03-27 DIAGNOSIS — G4700 Insomnia, unspecified: Secondary | ICD-10-CM | POA: Diagnosis not present

## 2022-03-27 DIAGNOSIS — F411 Generalized anxiety disorder: Secondary | ICD-10-CM | POA: Diagnosis not present

## 2022-03-27 DIAGNOSIS — I4891 Unspecified atrial fibrillation: Secondary | ICD-10-CM | POA: Diagnosis not present

## 2022-03-27 DIAGNOSIS — D6869 Other thrombophilia: Secondary | ICD-10-CM | POA: Diagnosis not present

## 2022-04-11 DIAGNOSIS — H18593 Other hereditary corneal dystrophies, bilateral: Secondary | ICD-10-CM | POA: Diagnosis not present

## 2022-04-11 DIAGNOSIS — H04123 Dry eye syndrome of bilateral lacrimal glands: Secondary | ICD-10-CM | POA: Diagnosis not present

## 2022-04-11 DIAGNOSIS — H0101A Ulcerative blepharitis right eye, upper and lower eyelids: Secondary | ICD-10-CM | POA: Diagnosis not present

## 2022-04-11 DIAGNOSIS — Z961 Presence of intraocular lens: Secondary | ICD-10-CM | POA: Diagnosis not present

## 2022-05-17 DIAGNOSIS — R21 Rash and other nonspecific skin eruption: Secondary | ICD-10-CM | POA: Diagnosis not present

## 2022-05-31 DIAGNOSIS — Z0000 Encounter for general adult medical examination without abnormal findings: Secondary | ICD-10-CM | POA: Diagnosis not present

## 2022-05-31 DIAGNOSIS — Z1389 Encounter for screening for other disorder: Secondary | ICD-10-CM | POA: Diagnosis not present

## 2022-06-06 ENCOUNTER — Telehealth: Payer: Self-pay | Admitting: Interventional Cardiology

## 2022-06-06 NOTE — Telephone Encounter (Signed)
Recommend she continue to use the hydrocortisone cream and can also pick up benadryl cream.  Do not recommend her discontinuing the metoprolol abruptly but if she is able to figure out who the previous manufacturer was she may be able to find a pharmacy that stocks it.  May also need another follow up with PCP

## 2022-06-06 NOTE — Telephone Encounter (Signed)
Patient called to talk to Dr. Eldridge Dace or nurse in regards to a question the patient has to ask. Please call back

## 2022-06-06 NOTE — Telephone Encounter (Signed)
Returned call to patient.  Patient reports rash on face (upper lip, side of mouth, and most recently between eyebrows). She states rash appeared 1 week ago. She states she had received a mouthwash from her dentist around this time and asked their office about the rash, she was told they never saw anyone have a reaction like hers to the mouthwash and advised her to speak with PCP.  Patient saw PCP Dr. Kevan Ny, received hydrocortisone and ketoconazole. Patient did not like using the hydrocortisone but reports it did lighten the rash. She states the ketoconazole did nothing.  Patient states rash is red, not painful, non-peeling. She is concerned and trying to figure out what may be causing the rash. She states she had an experience in the past with cholesterol medication causing a facial rash after a change in manufacturer. She states her metoprolol tartrate pill changed in appearance about a year ago and wonders if that could potentially be the cause of her rash now.  Patient confirms she is no longer using mouthwash prescribed by dentist.  Will forward to PharmD to review and advise.

## 2022-06-06 NOTE — Telephone Encounter (Signed)
Spoke with patient and shared recommendation from PharmD:  Recommend she continue to use the hydrocortisone cream and can also pick up benadryl cream.  Do not recommend her discontinuing the metoprolol abruptly but if she is able to figure out who the previous manufacturer was she may be able to find a pharmacy that stocks it.  May also need another follow up with PCP  Patient states she is not sure who previous manufacturer of metoprolol was. She states she will try Benadryl cream. Advised follow-up with PCP, patient verbalized understanding.

## 2022-06-07 DIAGNOSIS — R21 Rash and other nonspecific skin eruption: Secondary | ICD-10-CM | POA: Diagnosis not present

## 2022-07-10 ENCOUNTER — Ambulatory Visit: Payer: Medicare PPO | Admitting: Interventional Cardiology

## 2022-07-15 DIAGNOSIS — Z23 Encounter for immunization: Secondary | ICD-10-CM | POA: Diagnosis not present

## 2022-07-18 NOTE — Progress Notes (Signed)
Cardiology Office Note   Date:  07/19/2022   ID:  [name] Diaz, DOB 05-10-1934, MRN [id number]  PCP:  [name] Huddle, MD    No chief complaint on file.  AFib  Wt Readings from Last 3 Encounters:  07/19/22 149 lb 9.6 oz (67.9 kg)  09/21/21 157 lb 13.6 oz (71.6 kg)  07/05/21 157 lb 12.8 oz (71.6 kg)       History of Present Illness: [name] Diaz is a 88 y.o. female     who has had AFib and MI. She has had a stroke secondary to atrial fibrillation in the past. She had a DES to LAD in 2011.   MI was thought to be due to a possible left atrial appendage embolism down the coronary.    In the past,  She has several rashes which may be related to Provera and Xarelto. THis has improved. She follows with dermatology.   She had coils placed in veins in her vulva due to bleeding in Jan 2016.  This was done at [facility] moved to [facility] in September 2019.     She stopped statins due to rash.  SHe did not want PCSK-9 inhibitor.    She continues to stay at [facility].  She thinks she can improve her diet.    She needs to walk more.  Se has cut back in 2023.    Denies : Chest pain. Dizziness. Leg edema. Nitroglycerin use. Orthopnea.  Paroxysmal nocturnal dyspnea. Shortness of breath. Syncope.    No bleeding problems. TOlerating anticoagulation.    Past Medical History:  Diagnosis Date   A-fib [facility])    Colonic diverticular disease    Dysrhythmia    Atrial fib   History of cataract surgery    with lens implant    History of colon polyps    Myocardial infarction [facility]) 2011   DES TO LAD   Shortness of breath    on exertion    Past Surgical History:  Procedure Laterality Date   EYE SURGERY     Bilateral Cataract surgery w/ lens implant     HYSTEROSCOPY WITH D & C  08/25/2012   Procedure: DILATATION AND CURETTAGE /HYSTEROSCOPY;  Surgeon: Thurnell Lose, MD;  Location: [facility];  Service: Gynecology;  Laterality: N/A;  [employer]   VEIN LIGATION  1960s      Current Outpatient Medications  Medication Sig Dispense Refill   Calcium Carbonate-Vitamin D 600-400 MG-UNIT tablet Take 1 tablet by mouth daily.      fish oil-omega-3 fatty acids 1000 MG capsule Take 3 g by mouth daily.     furosemide (LASIX) 20 MG tablet Take 1 tablet (20 mg total) by mouth daily. May take one extra tablet daily as needed for swelling, weight gain or shortness of breath 120 tablet 3   metoprolol tartrate (LOPRESSOR) 100 MG tablet TAKE ONE TABLET BY MOUTH TWICE DAILY 180 tablet 2   Multiple Vitamin (MULTIVITAMIN WITH MINERALS) TABS Take 1 tablet by mouth daily.     rivaroxaban (XARELTO) 20 MG TABS tablet Take 1 tablet (20 mg total) by mouth daily with supper. 90 tablet 3   sertraline (ZOLOFT) 25 MG tablet Take 25-50 mg by mouth every morning.     nitroGLYCERIN (NITROSTAT) 0.4 MG SL tablet Place 1 tablet (0.4 mg total) under the tongue every 5 (five) minutes as needed for chest pain. (Patient not taking: Reported on 07/19/2022) 75 tablet 1   No current  facility-administered medications for this visit.    Allergies:   Clindamycin/lincomycin, Other, Pollen extract, and Simvastatin    Social History:  The patient  reports that she has quit smoking. Her smoking use included cigarettes. She has never used smokeless tobacco. She reports that she does not drink alcohol and does not use drugs.   Family History:  The patient's family history includes CVA in her mother; Heart attack in her brother.    ROS:  Please see the history of present illness.   Otherwise, review of systems are positive for occasional knee pain.   All other systems are reviewed and negative.    PHYSICAL EXAM: VS:  BP 124/72   Pulse 69   Ht 5\' 7"  (1.702 m)   Wt 149 lb 9.6 oz (67.9 kg)   SpO2 96%   BMI 23.43 kg/m  , BMI Body mass index is 23.43 kg/m. GEN: Well nourished, well developed, in no acute distress HEENT: normal Neck: no JVD, carotid bruits, or masses Cardiac: RRR; no murmurs, rubs, or  gallops,no edema  Respiratory:  clear to auscultation bilaterally, normal work of breathing GI: soft, nontender, nondistended, + BS MS: no deformity or atrophy Skin: warm and dry, no rash Neuro:  Strength and sensation are intact Psych: euthymic mood, full affect   EKG:   The ekg ordered today demonstrates AFib, rate controlled.    Recent Labs: No results found for requested labs within last 365 days.   Lipid Panel    Component Value Date/Time   CHOL 199 07/04/2021 1106   TRIG 72 07/04/2021 1106   HDL 75 07/04/2021 1106   CHOLHDL 2.7 07/04/2021 1106   CHOLHDL 1.7 03/25/2016 0842   VLDL 10 03/25/2016 0842   LDLCALC 111 (H) 07/04/2021 1106     Other studies Reviewed: Additional studies/ records that were reviewed today with results demonstrating: labs reviewed; LDL 111.   ASSESSMENT AND PLAN:  AFib:  Rate controlled.  Continue current medications. Chronic diastolic heart failure: appears euvolemic.  CAD/old MI: No angina. Continue aggressive secondary prevention.  Venous insufficiency: Varicose veins.  Elevate legs.  Not using compression stockings.  Anticoagulated: Acquired thrombophilia in the setting of atrial fibrillation Hyperlipidemia: She has been intolerant of statins in the past.  MI was embolic rather than plaque rupture.  Continue healthy diet.   Current medicines are reviewed at length with the patient today.  The patient concerns regarding her medicines were addressed.  The following changes have been made:  No change  Labs/ tests ordered today include:  No orders of the defined types were placed in this encounter.   Recommend 150 minutes/week of aerobic exercise Low fat, low carb, high fiber diet recommended  Disposition:   FU in 1 year   Signed, 07/06/2021, MD  07/19/2022 11:51 AM    [facility] [address] [place], [place], [place]  [place] Phone: [phone]; Fax: [phone]

## 2022-07-19 ENCOUNTER — Encounter: Payer: Self-pay | Admitting: Interventional Cardiology

## 2022-07-19 ENCOUNTER — Ambulatory Visit: Payer: Medicare PPO | Attending: Interventional Cardiology | Admitting: Interventional Cardiology

## 2022-07-19 VITALS — BP 124/72 | HR 69 | Ht 67.0 in | Wt 149.6 lb

## 2022-07-19 DIAGNOSIS — E785 Hyperlipidemia, unspecified: Secondary | ICD-10-CM | POA: Diagnosis not present

## 2022-07-19 DIAGNOSIS — I252 Old myocardial infarction: Secondary | ICD-10-CM

## 2022-07-19 DIAGNOSIS — Z7901 Long term (current) use of anticoagulants: Secondary | ICD-10-CM

## 2022-07-19 DIAGNOSIS — I872 Venous insufficiency (chronic) (peripheral): Secondary | ICD-10-CM | POA: Diagnosis not present

## 2022-07-19 DIAGNOSIS — I482 Chronic atrial fibrillation, unspecified: Secondary | ICD-10-CM

## 2022-07-19 NOTE — Patient Instructions (Signed)
Medication Instructions:  Your physician recommends that you continue on your current medications as directed. Please refer to the Current Medication list given to you today.  *If you need a refill on your cardiac medications before your next appointment, please call your pharmacy*   Lab Work: Lab work to be done today--CMET, Lipids, CBC, TSH If you have labs (blood work) drawn today and your tests are completely normal, you will receive your results only by: MyChart Message (if you have MyChart) OR A paper copy in the mail If you have any lab test that is abnormal or we need to change your treatment, we will call you to review the results.   Testing/Procedures: none   Follow-Up: At [facility], you and your health needs are our priority.  As part of our continuing mission to provide you with exceptional heart care, we have created designated Provider Care Teams.  These Care Teams include your primary Cardiologist (physician) and Advanced Practice Providers (APPs -  Physician Assistants and Nurse Practitioners) who all work together to provide you with the care you need, when you need it.  We recommend signing up for the patient portal called "MyChart".  Sign up information is provided on this After Visit Summary.  MyChart is used to connect with patients for Virtual Visits (Telemedicine).  Patients are able to view lab/test results, encounter notes, upcoming appointments, etc.  Non-urgent messages can be sent to your provider as well.   To learn more about what you can do with MyChart, go to [email or web address].com.    Your next appointment:   12 month(s)  The format for your next appointment:   In Person  Provider:   Jayadeep Varanasi, MD     Other Instructions   Important Information About Sugar       

## 2022-07-20 LAB — COMPREHENSIVE METABOLIC PANEL
ALT: 22 IU/L (ref 0–32)
AST: 29 IU/L (ref 0–40)
Albumin/Globulin Ratio: 3.1 — ABNORMAL HIGH (ref 1.2–2.2)
Albumin: 4.9 g/dL — ABNORMAL HIGH (ref 3.7–4.7)
Alkaline Phosphatase: 94 IU/L (ref 44–121)
BUN/Creatinine Ratio: 29 — ABNORMAL HIGH (ref 12–28)
BUN: 22 mg/dL (ref 8–27)
Bilirubin Total: 0.8 mg/dL (ref 0.0–1.2)
CO2: 29 mmol/L (ref 20–29)
Calcium: 9.9 mg/dL (ref 8.7–10.3)
Chloride: 99 mmol/L (ref 96–106)
Creatinine, Ser: 0.76 mg/dL (ref 0.57–1.00)
Globulin, Total: 1.6 g/dL (ref 1.5–4.5)
Glucose: 97 mg/dL (ref 70–99)
Potassium: 4.2 mmol/L (ref 3.5–5.2)
Sodium: 141 mmol/L (ref 134–144)
Total Protein: 6.5 g/dL (ref 6.0–8.5)
eGFR: 76 mL/min/{1.73_m2} (ref >59)

## 2022-07-20 LAB — LIPID PANEL
Chol/HDL Ratio: 2.2 ratio (ref 0.0–4.4)
Cholesterol, Total: 181 mg/dL (ref 100–199)
HDL: 82 mg/dL (ref >39)
LDL Chol Calc (NIH): 87 mg/dL (ref 0–99)
Triglycerides: 63 mg/dL (ref 0–149)
VLDL Cholesterol Cal: 12 mg/dL (ref 5–40)

## 2022-07-20 LAB — CBC
Hematocrit: 39.6 % (ref 34.0–46.6)
Hemoglobin: 13.3 g/dL (ref 11.1–15.9)
MCH: 30.8 pg (ref 26.6–33.0)
MCHC: 33.6 g/dL (ref 31.5–35.7)
MCV: 92 fL (ref 79–97)
Platelets: 169 10*3/uL (ref 150–450)
RBC: 4.32 x10E6/uL (ref 3.77–5.28)
RDW: 13 % (ref 11.7–15.4)
WBC: 12.3 10*3/uL — ABNORMAL HIGH (ref 3.4–10.8)

## 2022-07-20 LAB — TSH: TSH: 3.42 u[IU]/mL (ref 0.450–4.500)

## 2022-07-30 DIAGNOSIS — L249 Irritant contact dermatitis, unspecified cause: Secondary | ICD-10-CM | POA: Diagnosis not present

## 2022-09-14 ENCOUNTER — Other Ambulatory Visit: Payer: Self-pay | Admitting: Interventional Cardiology

## 2022-09-17 DIAGNOSIS — L2089 Other atopic dermatitis: Secondary | ICD-10-CM | POA: Diagnosis not present

## 2022-09-30 DIAGNOSIS — L2089 Other atopic dermatitis: Secondary | ICD-10-CM | POA: Diagnosis not present

## 2022-10-03 DIAGNOSIS — L2389 Allergic contact dermatitis due to other agents: Secondary | ICD-10-CM | POA: Diagnosis not present

## 2022-10-03 DIAGNOSIS — L308 Other specified dermatitis: Secondary | ICD-10-CM | POA: Diagnosis not present

## 2022-10-03 DIAGNOSIS — L2089 Other atopic dermatitis: Secondary | ICD-10-CM | POA: Diagnosis not present

## 2022-10-03 DIAGNOSIS — L309 Dermatitis, unspecified: Secondary | ICD-10-CM | POA: Diagnosis not present

## 2022-10-11 DIAGNOSIS — H04123 Dry eye syndrome of bilateral lacrimal glands: Secondary | ICD-10-CM | POA: Diagnosis not present

## 2022-10-11 DIAGNOSIS — Z961 Presence of intraocular lens: Secondary | ICD-10-CM | POA: Diagnosis not present

## 2022-10-11 DIAGNOSIS — H18593 Other hereditary corneal dystrophies, bilateral: Secondary | ICD-10-CM | POA: Diagnosis not present

## 2022-10-11 DIAGNOSIS — H26491 Other secondary cataract, right eye: Secondary | ICD-10-CM | POA: Diagnosis not present

## 2022-12-23 ENCOUNTER — Other Ambulatory Visit: Payer: Self-pay

## 2022-12-23 MED ORDER — RIVAROXABAN 20 MG PO TABS: 20.0000 mg | ORAL_TABLET | Freq: Every day | ORAL | 1 refills | Status: DC

## 2022-12-23 MED ORDER — FUROSEMIDE 20 MG PO TABS: 20.0000 mg | ORAL_TABLET | Freq: Every day | ORAL | 1 refills | Status: DC

## 2022-12-23 NOTE — Telephone Encounter (Signed)
Prescription refill request for Xarelto received.   Indication: afib  Last office visit: [name] Diaz, 07/19/2022 Weight:67.9 kg  Age: 89 yo  Scr: 0.76, 07/19/2022 CrCl: 57 ml/min   Refill sent.

## 2022-12-24 ENCOUNTER — Telehealth: Payer: Self-pay | Admitting: Interventional Cardiology

## 2022-12-24 NOTE — Telephone Encounter (Signed)
Pt c/o medication issue:  1. Name of Medication: Furosemide  2. How are you currently taking this medication (dosage and times per day)?   3. Are you having a reaction (difficulty breathing--STAT)?   4. What is your medication issue? Please call today if possible- questions about the new prescription for the Furosemide

## 2022-12-24 NOTE — Telephone Encounter (Signed)
Patient called in upset and stated she wanted to know why there was a drastic change in her lasix that was not discussed with her. She stated that she went from taking '20mg'$  tab one in the morning and one at night to just '20mg'$  once a day. She aslo wanted to know why the quantity dispensed to her is different. She used to receive a 90 day supply which gives her 180 tab to take twice a day, and she needs to know why the provider made all these changes and now she will be off. I tried explaining to her that looking over her last office visit 07/19/22 the directions are the same as the prescription she picked up today *take one tablet 20 mg daily and she may take one extra dose daily  as needed for swelling, weight gain or shortness of breath.*  I also explained to patient that she will not be off and the quantity dispensed is enough to cover her for 90 even if she has to take an as needed dose with the refill as well. I also informed patient that if she is having swelling, weight gain or shortness of breath everyday she needs to give [place] a call. She stated the changes had her scared and Dr. Irish Lack needs to explain what happened and she expects him to reply. I stated that it was no reason for her to be upset or afraid because there was no changes to her medications and she will not be off.

## 2022-12-25 MED ORDER — FUROSEMIDE 20 MG PO TABS: 20.0000 mg | ORAL_TABLET | Freq: Every day | ORAL | 2 refills | Status: DC

## 2022-12-25 NOTE — Telephone Encounter (Signed)
I spoke with patient and told her there had not been a change in her lasix dose and she should be taking lasix 20 mg daily and could take one extra tablet daily as needed for swelling, weight gain or shortness of breath.  Patient would like prescription written for 180 tablets so she could take extra dose daily if needed.  Prescription sent to [facility]

## 2023-01-27 ENCOUNTER — Ambulatory Visit: Payer: Self-pay | Admitting: Family

## 2023-01-28 ENCOUNTER — Ambulatory Visit: Payer: Medicare PPO | Admitting: Family

## 2023-02-13 ENCOUNTER — Ambulatory Visit: Payer: Medicare PPO | Admitting: Adult Health

## 2023-03-26 ENCOUNTER — Telehealth: Payer: Self-pay | Admitting: Interventional Cardiology

## 2023-03-26 NOTE — Telephone Encounter (Signed)
Pt c/o medication issue:  1. Name of Medication: furosemide (LASIX) 20 MG tablet   2. How are you currently taking this medication (dosage and times per day)? Not sure   3. Are you having a reaction (difficulty breathing--STAT)? No   4. What is your medication issue? She states she has conflicting instructions on his two bottles of pills and unsure which one to use. Please advise.

## 2023-03-26 NOTE — Telephone Encounter (Signed)
THank you, Pam.

## 2023-03-26 NOTE — Telephone Encounter (Signed)
Called patient about her message. Patient stated she already talked to pharmacy about her furosemide and requested refills on metoprolol and xarelto. Went over furosemide instructions, just in case patient was still having conflicting instructions for her furosemide. Patient then started complaining of not being able to get a hold of Dr. Eldridge Dace when she needs to. Patient stated she is frustrated, and does not get return calls in the same day. Tried to tell patient about Mychart, which seemed to confuse her. Patient continued to complain, asked patient if she has filled out any surveys after office visits to get her complaints heard. Patient did not know about any surveys and continued to complain. Tried to be up beat with patient, but this did not work. In the end, gave patient the number for [employer] Patient [employer] to see if they can help her. Patient stated she would give them a call. Will forward to Dr. Eldridge Dace and his nurse, so they are aware. Spent over 18 minutes on the phone with patient.

## 2023-03-31 DIAGNOSIS — L600 Ingrowing nail: Secondary | ICD-10-CM | POA: Diagnosis not present

## 2023-04-08 ENCOUNTER — Telehealth: Payer: Self-pay | Admitting: Interventional Cardiology

## 2023-04-08 DIAGNOSIS — R0602 Shortness of breath: Secondary | ICD-10-CM

## 2023-04-08 NOTE — Telephone Encounter (Signed)
Left message to call office tomorrow after 8 AM 

## 2023-04-08 NOTE — Telephone Encounter (Signed)
   Pt c/o swelling: STAT is pt has developed SOB within 24 hours  If swelling, where is the swelling located? Ankles   How much weight have you gained and in what time span? Not sure   Have you gained 3 pounds in a day or 5 pounds in a week? Not sure   Do you have a log of your daily weights (if so, list)? Did not get on the scale but states "I can tell I am retaining the fluid"   Are you currently taking a fluid pill? Yes   Are you currently SOB? No, but has experienced it before   Have you traveled recently? No    Pt states her ankles are still swelling. She states she has been doubling up on the furosemide but concerned if she should be taking them together or separately. She also expressed concerned about the time of day she has been taking them. She states she has been taking mid day and wants to know if that's okay. Please advise.

## 2023-04-09 NOTE — Telephone Encounter (Signed)
Patient is returning RN's call. She is requesting a callback from another RN since Dennie Bible is out of office today. Please advise.

## 2023-04-09 NOTE — Telephone Encounter (Signed)
Let's check an echo if [facility] has been going on despite extra Lasix.  WOuld also check BMet and BNP.  Leg elevation and compression for now.

## 2023-04-09 NOTE — Telephone Encounter (Signed)
Spoke with pt and pt  has been taking routinely Furosemide 40 mg every day for weeks  and still notes swelling to feet and  ankles some SOB and abdominal swelling  Pt does have compression stockings and does not want to wear at this time due to the heat Also pt elevates as much as possible and pt notes upon arising in the am some swelling remains Will forward to Dr Eldridge Dace for review and recommendations ./cy

## 2023-04-10 NOTE — Telephone Encounter (Signed)
Patient notified.  I recommended patient have lab work scheduled soon but patient would like to wait and have lab work done the same day as echo.  She is aware this will probably be in mid July.

## 2023-04-29 NOTE — Addendum Note (Signed)
Addended by: Dossie Arbour on: 04/29/2023 09:34 AM   Modules accepted: Orders

## 2023-05-08 ENCOUNTER — Ambulatory Visit (HOSPITAL_COMMUNITY): Payer: Medicare PPO | Attending: Interventional Cardiology

## 2023-05-08 ENCOUNTER — Ambulatory Visit: Payer: Medicare PPO

## 2023-05-08 DIAGNOSIS — R0602 Shortness of breath: Secondary | ICD-10-CM

## 2023-05-08 LAB — ECHOCARDIOGRAM COMPLETE
Area-P 1/2: 5.64 cm2
P 1/2 time: 302 msec
S' Lateral: 3 cm

## 2023-05-09 LAB — BASIC METABOLIC PANEL
BUN/Creatinine Ratio: 21 (ref 12–28)
BUN: 15 mg/dL (ref 8–27)
CO2: 26 mmol/L (ref 20–29)
Calcium: 10.1 mg/dL (ref 8.7–10.3)
Chloride: 102 mmol/L (ref 96–106)
Creatinine, Ser: 0.73 mg/dL (ref 0.57–1.00)
Glucose: 95 mg/dL (ref 70–99)
Potassium: 4.6 mmol/L (ref 3.5–5.2)
Sodium: 142 mmol/L (ref 134–144)
eGFR: 80 mL/min/{1.73_m2} (ref >59)

## 2023-05-09 LAB — PRO B NATRIURETIC PEPTIDE: NT-Pro BNP: 708 pg/mL (ref 0–738)

## 2023-05-12 ENCOUNTER — Telehealth: Payer: Self-pay

## 2023-05-12 NOTE — Telephone Encounter (Signed)
Per Dr. Eldridge Dace, called to arrange Structural Heart appointment with Dr. Lynnette Caffey.  Left message to call back.

## 2023-05-12 NOTE — Telephone Encounter (Signed)
-----   Message from Orbie Pyo sent at 05/09/2023  8:15 AM EDT ----- Daleen Snook, she has a lot of MR and TR.  The TR may be worse.  Typically we would proceed with mitral TEER first and see how the TR respond.  A RHC will tell us something about this.  She has BAE from AF so we may not get a significant decrease in her TR (leading to a decrease in her peripheral edema).  Probably worthwhile to have her see [place] to start an evaluation.  If we end up doing a M-TEER here and if she needs T-TEER down the road, we can get her to [facility]. ----- Message ----- From: Corky Crafts, MD Sent: 05/09/2023   8:07 AM EDT To: Dossie Arbour, RN; Orbie Pyo, MD  Normal LV/RV.  Leaking of the mitral and tricuspid valve which may be contributing to worsening leg swelling and fluid retenion. Will check with valve team to discuss options.    Arun, please take a look at her echo.  Her BNP is mildly increased.  Fluid retention despite more regular Lasix.  Both mitral and tricuspid regurg.  Option for TEER?  Thanks.

## 2023-05-13 NOTE — Telephone Encounter (Signed)
Spoke with the patient at length. She is annoyed to come in for a visit but ultimately agreed to consult with Dr. Lynnette Caffey 06/16/2023. Appointment information sent via letter - address confirmed. She understands to call prior to that appointment if she decides she does not want to come.

## 2023-05-15 ENCOUNTER — Telehealth: Payer: Self-pay | Admitting: Cardiology

## 2023-05-15 NOTE — Telephone Encounter (Signed)
Patient referred to the Structural Heart team given severe MR and scheduled for consultation with Dr. Lynnette Caffey on 06/16/23. She called today to cancel this appointment and does not wish to rescheduled at this time. Shared with Dr. Eldridge Dace and Karsten Fells, RN.    Georgie Chard NP-C Structural Heart Team  Pager: [phone] Phone: [phone]

## 2023-05-16 ENCOUNTER — Telehealth: Payer: Self-pay | Admitting: Internal Medicine

## 2023-05-16 NOTE — Telephone Encounter (Signed)
Pt would like to go over what her possible appointment with the structural appointment is about. Please advise.

## 2023-05-16 NOTE — Telephone Encounter (Signed)
Spoke with patient about the need for an appointment with Dr Lynnette Caffey to address her leaking mitral and tricuspid valves, which contributed to her fluid retention. Patient didn't understand why she needed to be seen again, felt like no one told her about this and refuses to keep appointments she wasn't told about well in advance. Patient very agitated on the phone, despite answering all of her questions she continues to insist that she will not be following up with Dr Lynnette Caffey.

## 2023-05-20 ENCOUNTER — Telehealth: Payer: Self-pay | Admitting: *Deleted

## 2023-05-20 NOTE — Telephone Encounter (Signed)
-----   Message from [facility] sent at 05/09/2023  8:15 AM EDT ----- Normal electrolytes,  upper normal BNP.  Can take Lasix 20-60 mg daily based on how she is feeling. Awaiting word from valve specialist.

## 2023-05-20 NOTE — Telephone Encounter (Signed)
I spoke with patient and reviewed lab results/recommendations with her.  Current lasix prescription is 20 mg daily and may take one extra tablet daily as needed for swelling,weight gain or shortness of breath.  Patient reports  she is currently taking lasix 20 mg daily and this is working well.  She does not want prescription with new instructions sent in at this time.   Patient does not want to schedule appointment with Dr Lynnette Caffey at this time.  She is aware she can call office back if she would like to schedule this appointment.

## 2023-05-22 ENCOUNTER — Telehealth: Payer: Self-pay

## 2023-05-22 NOTE — Telephone Encounter (Signed)
The patient called and wishes to reschedule her Structural Heart consult with Dr. Lynnette Caffey. Scheduled her 06/27/2023. She was grateful for assistance.

## 2023-05-26 ENCOUNTER — Telehealth: Payer: Self-pay | Admitting: Interventional Cardiology

## 2023-05-26 NOTE — Telephone Encounter (Signed)
  The patient mentioned that she received a letter regarding TEE or echo and does not understand what it means. She requested a callback from Dr. Hoyle Barr nurse.

## 2023-05-26 NOTE — Telephone Encounter (Signed)
Left message to call office

## 2023-06-09 DIAGNOSIS — Z0000 Encounter for general adult medical examination without abnormal findings: Secondary | ICD-10-CM | POA: Diagnosis not present

## 2023-06-09 DIAGNOSIS — I5032 Chronic diastolic (congestive) heart failure: Secondary | ICD-10-CM | POA: Diagnosis not present

## 2023-06-09 DIAGNOSIS — I4821 Permanent atrial fibrillation: Secondary | ICD-10-CM | POA: Diagnosis not present

## 2023-06-09 DIAGNOSIS — F5101 Primary insomnia: Secondary | ICD-10-CM | POA: Diagnosis not present

## 2023-06-09 DIAGNOSIS — D6869 Other thrombophilia: Secondary | ICD-10-CM | POA: Diagnosis not present

## 2023-06-09 DIAGNOSIS — M8589 Other specified disorders of bone density and structure, multiple sites: Secondary | ICD-10-CM | POA: Diagnosis not present

## 2023-06-09 DIAGNOSIS — F419 Anxiety disorder, unspecified: Secondary | ICD-10-CM | POA: Diagnosis not present

## 2023-06-09 DIAGNOSIS — Z1331 Encounter for screening for depression: Secondary | ICD-10-CM | POA: Diagnosis not present

## 2023-06-09 DIAGNOSIS — R4189 Other symptoms and signs involving cognitive functions and awareness: Secondary | ICD-10-CM | POA: Diagnosis not present

## 2023-06-09 DIAGNOSIS — E7800 Pure hypercholesterolemia, unspecified: Secondary | ICD-10-CM | POA: Diagnosis not present

## 2023-06-09 DIAGNOSIS — Z23 Encounter for immunization: Secondary | ICD-10-CM | POA: Diagnosis not present

## 2023-06-09 DIAGNOSIS — Z79899 Other long term (current) drug therapy: Secondary | ICD-10-CM | POA: Diagnosis not present

## 2023-06-16 ENCOUNTER — Institutional Professional Consult (permissible substitution): Payer: Medicare PPO | Admitting: Internal Medicine

## 2023-06-21 ENCOUNTER — Other Ambulatory Visit: Payer: Self-pay | Admitting: Interventional Cardiology

## 2023-06-24 ENCOUNTER — Telehealth: Payer: Self-pay | Admitting: Interventional Cardiology

## 2023-06-24 ENCOUNTER — Other Ambulatory Visit: Payer: Self-pay

## 2023-06-24 MED ORDER — RIVAROXABAN 20 MG PO TABS: 20.0000 mg | ORAL_TABLET | Freq: Every day | ORAL | 1 refills | Status: DC

## 2023-06-24 NOTE — Telephone Encounter (Signed)
error 

## 2023-06-24 NOTE — Telephone Encounter (Signed)
Prescription refill request for Xarelto received.  Indication:afib Last office visit:9/23 Weight:67.9  kg Age:89 Scr:0.73  7/24 CrCl:58.2  ml/min  Prescription refilled

## 2023-06-26 ENCOUNTER — Telehealth: Payer: Self-pay

## 2023-06-26 NOTE — Progress Notes (Signed)
Patient ID: [name] Diaz MRN: [id number] DOB/AGE: 87-30-1935 87 y.o.  Primary Care Physician:Raju, Dorris Singh, MD Primary Cardiologist: Everette Rank, MD  PATIENT DID NOT APPEAR FOR APPOINTMENT   FOCUSED CARDIOVASCULAR PROBLEM LIST:   Longstanding atrial fibrillation on Xarelto Stroke and possible coronary embolism due to atrial fibrillation Stenting of LAD due to possible coronary embolism 2011 Hyperlipidemia intolerant of statins Severe mitral regurgitation (looks to be atrial functional with posterior prolapse) Moderate to severe tricuspid regurgitation  HISTORY OF PRESENT ILLNESS: The patient is a 89 y.o. female with the indicated medical history here for recommendations regarding her mitral regurgitation.  The patient was last seen by Dr. Eldridge Dace about a year ago.  She was doing well from a cardiovascular standpoint.  Due to worsening shortness of breath patient a proBNP which was within normal limits but at the higher range.  Her BMP was also normal.  An echocardiogram demonstrated significant mitral and tricuspid regurgitation.  For this reason she is referred for further recommendations.   Past Medical History:  Diagnosis Date   A-fib [facility])    Colonic diverticular disease    Dysrhythmia    Atrial fib   History of cataract surgery    with lens implant    History of colon polyps    Myocardial infarction [facility]) 2011   DES TO LAD   Shortness of breath    on exertion    Past Surgical History:  Procedure Laterality Date   EYE SURGERY     Bilateral Cataract surgery w/ lens implant     HYSTEROSCOPY WITH D & C  08/25/2012   Procedure: DILATATION AND CURETTAGE /HYSTEROSCOPY;  Surgeon: Geryl Rankins, MD;  Location: [facility];  Service: Gynecology;  Laterality: N/A;  [employer]   VEIN LIGATION  1960s    Family History  Problem Relation Age of Onset   CVA Mother    Heart attack Brother    Breast cancer Neg Hx     Social History   Socioeconomic History   Marital status:  Widowed    Spouse name: Not on file   Number of children: Not on file   Years of education: Not on file   Highest education level: Not on file  Occupational History   Not on file  Tobacco Use   Smoking status: Former    Types: Cigarettes   Smokeless tobacco: Never  Substance and Sexual Activity   Alcohol use: No   Drug use: No   Sexual activity: Not on file  Other Topics Concern   Not on file  Social History Narrative   Not on file   Social Determinants of Health   Financial Resource Strain: Not on file  Food Insecurity: Not on file  Transportation Needs: Not on file  Physical Activity: Not on file  Stress: Not on file  Social Connections: Not on file  Intimate Partner Violence: Not on file     Prior to Admission medications   Medication Sig Start Date End Date Taking? Authorizing Provider  Calcium Carbonate-Vitamin D 600-400 MG-UNIT tablet Take 1 tablet by mouth daily.     [provider]  fish oil-omega-3 fatty acids 1000 MG capsule Take 3 g by mouth daily.    [provider]  furosemide (LASIX) 20 MG tablet Take 1 tablet (20 mg total) by mouth daily. May take one extra tablet daily as needed for swelling, weight gain or shortness of breath 12/25/22   Corky Crafts, MD  metoprolol tartrate (LOPRESSOR)  100 MG tablet TAKE ONE TABLET BY MOUTH TWICE DAILY 06/24/23   Corky Crafts, MD  Multiple Vitamin (MULTIVITAMIN WITH MINERALS) TABS Take 1 tablet by mouth daily.    [provider]  nitroGLYCERIN (NITROSTAT) 0.4 MG SL tablet Place 1 tablet (0.4 mg total) under the tongue every 5 (five) minutes as needed for chest pain. Patient not taking: Reported on 07/19/2022 07/11/21   Corky Crafts, MD  rivaroxaban (XARELTO) 20 MG TABS tablet Take 1 tablet (20 mg total) by mouth daily with supper. 06/24/23   Corky Crafts, MD  sertraline (ZOLOFT) 25 MG tablet Take 25-50 mg by mouth every morning. 07/03/22   [provider]     Allergies  Allergen Reactions   Clindamycin/Lincomycin Diarrhea   Other Diarrhea   Pollen Extract Other (See Comments)    REACTION UNKNOWN   Simvastatin Rash    FACIAL RASH    REVIEW OF SYSTEMS:  General: no fevers/chills/night sweats Eyes: no blurry vision, diplopia, or amaurosis ENT: no sore throat or hearing loss Resp: no cough, wheezing, or hemoptysis CV: no edema or palpitations GI: no abdominal pain, nausea, vomiting, diarrhea, or constipation GU: no dysuria, frequency, or hematuria Skin: no rash Neuro: no headache, numbness, tingling, or weakness of extremities Musculoskeletal: no joint pain or swelling Heme: no bleeding, DVT, or easy bruising Endo: no polydipsia or polyuria  There were no vitals taken for this visit.  PHYSICAL EXAM: GEN:  AO x 3 in no acute distress HEENT: normal Dentition: Normal*** Neck: JVP normal. +2***carotid upstrokes without bruits. No thyromegaly. Lungs: equal expansion, clear bilaterally CV: Apex is discrete and nondisplaced, RRR without murmur or gallop*** Abd: soft, non-tender, non-distended; no bruit; positive bowel sounds Ext: no edema, ecchymoses, or cyanosis Vascular: 2+ femoral pulses, 2+ radial pulses       Skin: warm and dry without rash Neuro: CN II-XII grossly intact; motor and sensory grossly intact    DATA AND STUDIES:       Cardiac Studies & Procedures     STRESS TESTS  MYOCARDIAL PERFUSION IMAGING 07/21/2019  Narrative  The left ventricular ejection fraction is hyperdynamic (>65%).  Nuclear stress EF: 69%.  No T wave inversion was noted during stress.  There was no ST segment deviation noted during stress.  This is a low risk study.  No reversible ischemia.  LVEF 69% with normal wall motion. Mild RV uptake noted, could suggest elevated pulmonary pressure. This is a low risk study.   ECHOCARDIOGRAM  ECHOCARDIOGRAM COMPLETE 05/08/2023  Narrative ECHOCARDIOGRAM REPORT    Patient Name:   [name] Diaz  Date of Exam: 05/08/2023 Medical Rec #:  [id number]     Height:       67.0 in Accession #:    [id number]    Weight:       149.6 lb Date of Birth:  06-Nov-1933     BSA:          1.787 m Patient Age:    88 years      BP:           124/72 mmHg Patient Gender: F             HR:           63 bpm. Exam Location:  [address]  Procedure: 2D Echo, Cardiac Doppler and Color Doppler  Indications:    R06.00 SOB  History:        Patient has prior history of Echocardiogram examinations, most  recent 11/23/2010. Previous Myocardial Infarction, Arrythmias:Atrial Fibrillation, Signs/Symptoms:Shortness of Breath; Risk Factors:Dyslipidemia.  Sonographer:    Samule Ohm RDCS Referring Phys: [id number] Corky Crafts   Sonographer Comments: Image acquisition challenging due to respiratory motion. IMPRESSIONS   1. Left ventricular ejection fraction, by estimation, is 55 to 60%. The left ventricle has normal function. The left ventricle has no regional wall motion abnormalities. Left ventricular diastolic parameters are indeterminate. 2. Right ventricular systolic function is normal. The right ventricular size is normal. There is moderately elevated pulmonary artery systolic pressure. The estimated right ventricular systolic pressure is 54.9 mmHg. 3. Left atrial size was severely dilated. 4. Right atrial size was severely dilated. 5. The mitral valve is abnormal. There appears to be some prolapse of the posterior leaflet with anteriorly-directed mitral regurgitation. The mitral annulus is also dilated. Moderate to severe mitral valve regurgitation. No evidence of mitral stenosis. 6. The tricuspid valve is abnormal. Tricuspid valve regurgitation is moderate to severe. 7. The aortic valve is tricuspid. Aortic valve regurgitation is trivial. 8. The inferior vena cava is dilated in size with <50% respiratory variability, suggesting right atrial pressure of 15 mmHg. 9. The patient was in atrial  fibrillation. 10. A small pericardial effusion is present. The pericardial effusion is posterior to the left ventricle.  FINDINGS Left Ventricle: Left ventricular ejection fraction, by estimation, is 55 to 60%. The left ventricle has normal function. The left ventricle has no regional wall motion abnormalities. The left ventricular internal cavity size was normal in size. There is no left ventricular hypertrophy. Left ventricular diastolic parameters are indeterminate.  Right Ventricle: The right ventricular size is normal. No increase in right ventricular wall thickness. Right ventricular systolic function is normal. There is moderately elevated pulmonary artery systolic pressure. The tricuspid regurgitant velocity is 3.16 m/s, and with an assumed right atrial pressure of 15 mmHg, the estimated right ventricular systolic pressure is 54.9 mmHg.  Left Atrium: Left atrial size was severely dilated.  Right Atrium: Right atrial size was severely dilated.  Pericardium: A small pericardial effusion is present. The pericardial effusion is posterior to the left ventricle.  Mitral Valve: The mitral valve is abnormal. Moderate to severe mitral valve regurgitation. No evidence of mitral valve stenosis.  Tricuspid Valve: The tricuspid valve is abnormal. Tricuspid valve regurgitation is moderate to severe.  Aortic Valve: The aortic valve is tricuspid. Aortic valve regurgitation is trivial. Aortic regurgitation PHT measures 302 msec.  Pulmonic Valve: The pulmonic valve was normal in structure. Pulmonic valve regurgitation is mild.  Aorta: The aortic root is normal in size and structure.  Venous: The inferior vena cava is dilated in size with less than 50% respiratory variability, suggesting right atrial pressure of 15 mmHg.  IAS/Shunts: No atrial level shunt detected by color flow Doppler.   LEFT VENTRICLE PLAX 2D LVIDd:         4.50 cm   Diastology LVIDs:         3.00 cm   LV e' medial:    9.32  cm/s LV PW:         1.00 cm   LV E/e' medial:  12.1 LV IVS:        1.10 cm   LV e' lateral:   12.97 cm/s LVOT diam:     1.60 cm   LV E/e' lateral: 8.7 LV SV:         30 LV SV Index:   17 LVOT Area:     2.01 cm   RIGHT  VENTRICLE            IVC RVSP:           42.9 mmHg  IVC diam: 2.30 cm  LEFT ATRIUM              Index        RIGHT ATRIUM           Index LA diam:        5.30 cm  2.97 cm/m   RA Pressure: 3.00 mmHg LA Vol (A2C):   88.7 ml  49.62 ml/m  RA Area:     33.50 cm LA Vol (A4C):   121.0 ml 67.69 ml/m  RA Volume:   121.00 ml 67.69 ml/m LA Biplane Vol: 108.0 ml 60.42 ml/m AORTIC VALVE LVOT Vmax:   77.60 cm/s LVOT Vmean:  48.880 cm/s LVOT VTI:    0.150 m AI PHT:      302 msec  AORTA Ao Root diam: 3.20 cm Ao Asc diam:  3.60 cm  MITRAL VALVE                TRICUSPID VALVE MV Area (PHT): 5.64 cm     TR Peak grad:   39.9 mmHg MV Decel Time: 135 msec     TR Vmax:        316.00 cm/s MV E velocity: 112.60 cm/s  Estimated RAP:  3.00 mmHg RVSP:           42.9 mmHg  SHUNTS Systemic VTI:  0.15 m Systemic Diam: 1.60 cm  Dalton McleanMD Electronically signed by Wilfred Lacy Signature Date/Time: 05/08/2023/3:20:54 PM    Final             07/19/2022: ALT 22; Hemoglobin 13.3; Platelets 169; TSH 3.420 05/08/2023: BUN 15; Creatinine, Ser 0.73; NT-Pro BNP 708; Potassium 4.6; Sodium 142   STS RISK CALCULATOR: Pending  NHYA CLASS: ***     ASSESSMENT AND PLAN:   PATIENT DID NOT APPEAR FOR APPOINTMENT   Nonrheumatic mitral valve regurgitation  Nonrheumatic tricuspid valve regurgitation  Chronic atrial fibrillation (HCC)  Secondary hypercoagulable state (HCC)  PATIENT DID NOT APPEAR FOR APPOINTMENT   Orbie Pyo, MD  06/26/2023 1:52 PM    [facility] [address] [place], [place], [place]  [address] Phone: [phone]; Fax: [phone]

## 2023-06-26 NOTE — Telephone Encounter (Signed)
Received a message from the scheduling team that the patient requested to cancel her Structural Heart consult with Dr. Lynnette Caffey tomorrow.  She is upset Dr. Eldridge Dace is leaving and does not want to see another provider. Instructed the scheduler to cancel appointment.

## 2023-06-27 ENCOUNTER — Ambulatory Visit: Payer: Medicare PPO | Admitting: Internal Medicine

## 2023-06-27 ENCOUNTER — Telehealth: Payer: Self-pay | Admitting: Internal Medicine

## 2023-06-27 NOTE — Telephone Encounter (Addendum)
The patient cancelled her Structural Heart consult with Dr. Lynnette Caffey for today yesterday.  Attempted to call Ms Dainty to reschedule her consult as "next step."  Left message to call back on Monday, 9/9.

## 2023-06-27 NOTE — Telephone Encounter (Signed)
Patient wants a call back to discuss next steps.

## 2023-06-30 ENCOUNTER — Telehealth: Payer: Self-pay | Admitting: Interventional Cardiology

## 2023-06-30 MED ORDER — FUROSEMIDE 20 MG PO TABS: 20.0000 mg | ORAL_TABLET | Freq: Every day | ORAL | 3 refills | Status: DC

## 2023-06-30 MED ORDER — METOPROLOL TARTRATE 100 MG PO TABS: 100.0000 mg | ORAL_TABLET | Freq: Two times a day (BID) | ORAL | 3 refills | Status: AC

## 2023-06-30 MED ORDER — RIVAROXABAN 20 MG PO TABS: 20.0000 mg | ORAL_TABLET | Freq: Every day | ORAL | 0 refills | Status: AC

## 2023-06-30 NOTE — Telephone Encounter (Signed)
Pt's medications were sent to pt's pharmacy as requested. Confirmation received.  

## 2023-06-30 NOTE — Addendum Note (Signed)
Addended by: Iona Coach on: 06/30/2023 03:16 PM   Modules accepted: Orders

## 2023-06-30 NOTE — Telephone Encounter (Addendum)
Medication sent to pt's pharmacy as requested. Confirmation received.

## 2023-06-30 NOTE — Telephone Encounter (Signed)
Patient was scheduled to see Dr Lynnette Caffey on 9/6 but canceled appointment.  Per 9/6 phone note message was left on 9/6 for patient to call office.

## 2023-06-30 NOTE — Telephone Encounter (Signed)
Phone call received from Iona Beard in regards to rescheduling the pt's evaluation with Dr Lynnette Caffey.  Per Zella she is the pt's POA and I made her aware that I do not have access to this information and she is not listed in the pt's chart on DPR.  I did reschedule the pt's appointment to 9/16 and advised Zella that she needs to bring POA documentation to this appointment and DPR will need to be updated so that we can discuss the patient's medical care with her in the future. She asked that the pt's medication be refilled to last until her appointment because the patient will run out of medications this week.  Per earlier phone note it appears that Furosemide and Metoprolol Tartrate have been authorized but Xarelto was denied.  I will send in a short term supply of Xarelto to keep the patient from missing doses of anticoagulant.

## 2023-06-30 NOTE — Telephone Encounter (Signed)
*  STAT* If patient is at the pharmacy, call can be transferred to refill team.   1. Which medications need to be refilled? (please list name of each medication and dose if known)   furosemide (LASIX) 20 MG tablet    rivaroxaban (XARELTO) 20 MG TABS tablet    metoprolol tartrate (LOPRESSOR) 100 MG tablet    2. Which pharmacy/location (including street and city if local pharmacy) is medication to be sent to? [facility] - [place], [place] - [address]    3. Do they need a 30 day or 90 day supply? 90 day

## 2023-07-06 NOTE — Progress Notes (Unsigned)
Patient ID: [name] Diaz MRN: [id number] DOB/AGE: [birth year] 89 y.o.  Primary Care Physician:Raju, Dorris Singh, MD Primary Cardiologist:  Lynnette Caffey (new)  FOCUSED CARDIOVASCULAR PROBLEM LIST:   Longstanding atrial fibrillation on Xarelto Stroke and possible coronary embolism due to atrial fibrillation Stenting of LAD due to possible coronary embolism 2011 Hyperlipidemia intolerant of statins Severe mitral regurgitation (looks to be atrial functional with posterior prolapse) Moderate to severe tricuspid regurgitation   HISTORY OF PRESENT ILLNESS: The patient is a 89 y.o. female with the indicated medical history here for recommendations regarding her mitral regurgitation.  The patient was last seen by Dr. Eldridge Dace about a year ago.  She was doing well from a cardiovascular standpoint.  Due to worsening shortness of breath patient a proBNP which was within normal limits but at the higher range.  Her BMP was also normal.  An echocardiogram demonstrated significant mitral and tricuspid regurgitation.  For this reason she is referred for further recommendations.    Past Medical History:  Diagnosis Date   A-fib [facility])    Colonic diverticular disease    Dysrhythmia    Atrial fib   History of cataract surgery    with lens implant    History of colon polyps    Myocardial infarction [facility]) 2011   DES TO LAD   Shortness of breath    on exertion    Past Surgical History:  Procedure Laterality Date   EYE SURGERY     Bilateral Cataract surgery w/ lens implant     HYSTEROSCOPY WITH D & C  08/25/2012   Procedure: DILATATION AND CURETTAGE /HYSTEROSCOPY;  Surgeon: Geryl Rankins, MD;  Location: [facility];  Service: Gynecology;  Laterality: N/A;  TruClear   VEIN LIGATION  1960s    Family History  Problem Relation Age of Onset   CVA Mother    Heart attack Brother    Breast cancer Neg Hx     Social History   Socioeconomic History   Marital status: Widowed    Spouse name: Not on file   Number  of children: Not on file   Years of education: Not on file   Highest education level: Not on file  Occupational History   Not on file  Tobacco Use   Smoking status: Former    Types: Cigarettes   Smokeless tobacco: Never  Substance and Sexual Activity   Alcohol use: No   Drug use: No   Sexual activity: Not on file  Other Topics Concern   Not on file  Social History Narrative   Not on file   Social Determinants of Health   Financial Resource Strain: Not on file  Food Insecurity: Not on file  Transportation Needs: Not on file  Physical Activity: Not on file  Stress: Not on file  Social Connections: Not on file  Intimate Partner Violence: Not on file     Prior to Admission medications   Medication Sig Start Date End Date Taking? Authorizing Provider  Calcium Carbonate-Vitamin D 600-400 MG-UNIT tablet Take 1 tablet by mouth daily.     [provider]  fish oil-omega-3 fatty acids 1000 MG capsule Take 3 g by mouth daily.    [provider]  furosemide (LASIX) 20 MG tablet Take 1 tablet (20 mg total) by mouth daily. May take one extra tablet daily as needed for swelling, weight gain or shortness of breath 06/30/23   Corky Crafts, MD  metoprolol tartrate (LOPRESSOR) 100 MG tablet Take 1 tablet (  100 mg total) by mouth 2 (two) times daily. 06/30/23   Corky Crafts, MD  Multiple Vitamin (MULTIVITAMIN WITH MINERALS) TABS Take 1 tablet by mouth daily.    [provider]  nitroGLYCERIN (NITROSTAT) 0.4 MG SL tablet Place 1 tablet (0.4 mg total) under the tongue every 5 (five) minutes as needed for chest pain. Patient not taking: Reported on 07/19/2022 07/11/21   Corky Crafts, MD  rivaroxaban (XARELTO) 20 MG TABS tablet Take 1 tablet (20 mg total) by mouth daily with supper. 06/30/23   Corky Crafts, MD  sertraline (ZOLOFT) 25 MG tablet Take 25-50 mg by mouth every morning. 07/03/22   [provider]    Allergies  Allergen Reactions    Clindamycin/Lincomycin Diarrhea   Other Diarrhea   Pollen Extract Other (See Comments)    REACTION UNKNOWN   Simvastatin Rash    FACIAL RASH    REVIEW OF SYSTEMS:  General: no fevers/chills/night sweats Eyes: no blurry vision, diplopia, or amaurosis ENT: no sore throat or hearing loss Resp: no cough, wheezing, or hemoptysis CV: no edema or palpitations GI: no abdominal pain, nausea, vomiting, diarrhea, or constipation GU: no dysuria, frequency, or hematuria Skin: no rash Neuro: no headache, numbness, tingling, or weakness of extremities Musculoskeletal: no joint pain or swelling Heme: no bleeding, DVT, or easy bruising Endo: no polydipsia or polyuria  There were no vitals taken for this visit.  PHYSICAL EXAM: GEN:  AO x 3 in no acute distress HEENT: normal Dentition: Normal*** Neck: JVP normal. +2***carotid upstrokes without bruits. No thyromegaly. Lungs: equal expansion, clear bilaterally CV: Apex is discrete and nondisplaced, RRR without murmur or gallop*** Abd: soft, non-tender, non-distended; no bruit; positive bowel sounds Ext: no edema, ecchymoses, or cyanosis Vascular: 2+ femoral pulses, 2+ radial pulses       Skin: warm and dry without rash Neuro: CN II-XII grossly intact; motor and sensory grossly intact    DATA AND STUDIES:       Cardiac Studies & Procedures     STRESS TESTS  MYOCARDIAL PERFUSION IMAGING 07/21/2019  Narrative  The left ventricular ejection fraction is hyperdynamic (>65%).  Nuclear stress EF: 69%.  No T wave inversion was noted during stress.  There was no ST segment deviation noted during stress.  This is a low risk study.  No reversible ischemia.  LVEF 69% with normal wall motion. Mild RV uptake noted, could suggest elevated pulmonary pressure. This is a low risk study.   ECHOCARDIOGRAM  ECHOCARDIOGRAM COMPLETE 05/08/2023  Narrative ECHOCARDIOGRAM REPORT    Patient Name:   [name] Diaz  Date of Exam:  05/08/2023 Medical Rec #:  [id number]     Height:       67.0 in Accession #:    [id number]    Weight:       149.6 lb Date of Birth:  06-22-1934     BSA:          1.787 m Patient Age:    88 years      BP:           124/72 mmHg Patient Gender: F             HR:           63 bpm. Exam Location:  [address]  Procedure: 2D Echo, Cardiac Doppler and Color Doppler  Indications:    R06.00 SOB  History:        Patient has prior history of Echocardiogram examinations, most recent  100 mg total) by mouth 2 (two) times daily. 06/30/23   Corky Crafts, MD  Multiple Vitamin (MULTIVITAMIN WITH MINERALS) TABS Take 1 tablet by mouth daily.    [provider]  nitroGLYCERIN (NITROSTAT) 0.4 MG SL tablet Place 1 tablet (0.4 mg total) under the tongue every 5 (five) minutes as needed for chest pain. Patient not taking: Reported on 07/19/2022 07/11/21   Corky Crafts, MD  rivaroxaban (XARELTO) 20 MG TABS tablet Take 1 tablet (20 mg total) by mouth daily with supper. 06/30/23   Corky Crafts, MD  sertraline (ZOLOFT) 25 MG tablet Take 25-50 mg by mouth every morning. 07/03/22   [provider]    Allergies  Allergen Reactions    Clindamycin/Lincomycin Diarrhea   Other Diarrhea   Pollen Extract Other (See Comments)    REACTION UNKNOWN   Simvastatin Rash    FACIAL RASH    REVIEW OF SYSTEMS:  General: no fevers/chills/night sweats Eyes: no blurry vision, diplopia, or amaurosis ENT: no sore throat or hearing loss Resp: no cough, wheezing, or hemoptysis CV: no edema or palpitations GI: no abdominal pain, nausea, vomiting, diarrhea, or constipation GU: no dysuria, frequency, or hematuria Skin: no rash Neuro: no headache, numbness, tingling, or weakness of extremities Musculoskeletal: no joint pain or swelling Heme: no bleeding, DVT, or easy bruising Endo: no polydipsia or polyuria  There were no vitals taken for this visit.  PHYSICAL EXAM: GEN:  AO x 3 in no acute distress HEENT: normal Dentition: Normal*** Neck: JVP normal. +2***carotid upstrokes without bruits. No thyromegaly. Lungs: equal expansion, clear bilaterally CV: Apex is discrete and nondisplaced, RRR without murmur or gallop*** Abd: soft, non-tender, non-distended; no bruit; positive bowel sounds Ext: no edema, ecchymoses, or cyanosis Vascular: 2+ femoral pulses, 2+ radial pulses       Skin: warm and dry without rash Neuro: CN II-XII grossly intact; motor and sensory grossly intact    DATA AND STUDIES:       Cardiac Studies & Procedures     STRESS TESTS  MYOCARDIAL PERFUSION IMAGING 07/21/2019  Narrative  The left ventricular ejection fraction is hyperdynamic (>65%).  Nuclear stress EF: 69%.  No T wave inversion was noted during stress.  There was no ST segment deviation noted during stress.  This is a low risk study.  No reversible ischemia.  LVEF 69% with normal wall motion. Mild RV uptake noted, could suggest elevated pulmonary pressure. This is a low risk study.   ECHOCARDIOGRAM  ECHOCARDIOGRAM COMPLETE 05/08/2023  Narrative ECHOCARDIOGRAM REPORT    Patient Name:   [name] Diaz  Date of Exam:  05/08/2023 Medical Rec #:  [id number]     Height:       67.0 in Accession #:    [id number]    Weight:       149.6 lb Date of Birth:  06-22-1934     BSA:          1.787 m Patient Age:    88 years      BP:           124/72 mmHg Patient Gender: F             HR:           63 bpm. Exam Location:  [address]  Procedure: 2D Echo, Cardiac Doppler and Color Doppler  Indications:    R06.00 SOB  History:        Patient has prior history of Echocardiogram examinations, most recent  Patient ID: [name] Diaz MRN: [id number] DOB/AGE: [birth year] 89 y.o.  Primary Care Physician:Raju, Dorris Singh, MD Primary Cardiologist:  Lynnette Caffey (new)  FOCUSED CARDIOVASCULAR PROBLEM LIST:   Longstanding atrial fibrillation on Xarelto Stroke and possible coronary embolism due to atrial fibrillation Stenting of LAD due to possible coronary embolism 2011 Hyperlipidemia intolerant of statins Severe mitral regurgitation (looks to be atrial functional with posterior prolapse) Moderate to severe tricuspid regurgitation   HISTORY OF PRESENT ILLNESS: The patient is a 89 y.o. female with the indicated medical history here for recommendations regarding her mitral regurgitation.  The patient was last seen by Dr. Eldridge Dace about a year ago.  She was doing well from a cardiovascular standpoint.  Due to worsening shortness of breath patient a proBNP which was within normal limits but at the higher range.  Her BMP was also normal.  An echocardiogram demonstrated significant mitral and tricuspid regurgitation.  For this reason she is referred for further recommendations.    Past Medical History:  Diagnosis Date   A-fib [facility])    Colonic diverticular disease    Dysrhythmia    Atrial fib   History of cataract surgery    with lens implant    History of colon polyps    Myocardial infarction [facility]) 2011   DES TO LAD   Shortness of breath    on exertion    Past Surgical History:  Procedure Laterality Date   EYE SURGERY     Bilateral Cataract surgery w/ lens implant     HYSTEROSCOPY WITH D & C  08/25/2012   Procedure: DILATATION AND CURETTAGE /HYSTEROSCOPY;  Surgeon: Geryl Rankins, MD;  Location: [facility];  Service: Gynecology;  Laterality: N/A;  TruClear   VEIN LIGATION  1960s    Family History  Problem Relation Age of Onset   CVA Mother    Heart attack Brother    Breast cancer Neg Hx     Social History   Socioeconomic History   Marital status: Widowed    Spouse name: Not on file   Number  of children: Not on file   Years of education: Not on file   Highest education level: Not on file  Occupational History   Not on file  Tobacco Use   Smoking status: Former    Types: Cigarettes   Smokeless tobacco: Never  Substance and Sexual Activity   Alcohol use: No   Drug use: No   Sexual activity: Not on file  Other Topics Concern   Not on file  Social History Narrative   Not on file   Social Determinants of Health   Financial Resource Strain: Not on file  Food Insecurity: Not on file  Transportation Needs: Not on file  Physical Activity: Not on file  Stress: Not on file  Social Connections: Not on file  Intimate Partner Violence: Not on file     Prior to Admission medications   Medication Sig Start Date End Date Taking? Authorizing Provider  Calcium Carbonate-Vitamin D 600-400 MG-UNIT tablet Take 1 tablet by mouth daily.     [provider]  fish oil-omega-3 fatty acids 1000 MG capsule Take 3 g by mouth daily.    [provider]  furosemide (LASIX) 20 MG tablet Take 1 tablet (20 mg total) by mouth daily. May take one extra tablet daily as needed for swelling, weight gain or shortness of breath 06/30/23   Corky Crafts, MD  metoprolol tartrate (LOPRESSOR) 100 MG tablet Take 1 tablet (

## 2023-07-07 ENCOUNTER — Encounter: Payer: Self-pay | Admitting: Internal Medicine

## 2023-07-07 ENCOUNTER — Ambulatory Visit: Payer: Medicare PPO | Attending: Internal Medicine | Admitting: Internal Medicine

## 2023-07-07 VITALS — BP 118/74 | HR 65 | Ht 67.0 in | Wt 152.2 lb

## 2023-07-07 DIAGNOSIS — D6869 Other thrombophilia: Secondary | ICD-10-CM | POA: Diagnosis not present

## 2023-07-07 DIAGNOSIS — I361 Nonrheumatic tricuspid (valve) insufficiency: Secondary | ICD-10-CM

## 2023-07-07 DIAGNOSIS — I340 Nonrheumatic mitral (valve) insufficiency: Secondary | ICD-10-CM | POA: Diagnosis not present

## 2023-07-07 DIAGNOSIS — I482 Chronic atrial fibrillation, unspecified: Secondary | ICD-10-CM

## 2023-07-07 MED ORDER — FUROSEMIDE 20 MG PO TABS: 20.0000 mg | ORAL_TABLET | Freq: Two times a day (BID) | ORAL | 3 refills | Status: DC

## 2023-07-07 NOTE — Patient Instructions (Addendum)
Medication Instructions:  Your physician has recommended you make the following change in your medication:  1.) change Lasix to 20 mg - one tablet TWICE A DAY  *If you need a refill on your cardiac medications before your next appointment, please call your pharmacy*   Lab Work: none   Testing/Procedures: ECHO DUE MARCH 2025 Your physician has requested that you have an echocardiogram. Echocardiography is a painless test that uses sound waves to create images of your heart. It provides your doctor with information about the size and shape of your heart and how well your heart's chambers and valves are working. This procedure takes approximately one hour. There are no restrictions for this procedure. Please do NOT wear cologne, perfume, aftershave, or lotions (deodorant is allowed). Please arrive 15 minutes prior to your appointment time.    Follow-Up: At [facility], you and your health needs are our priority.  As part of our continuing mission to provide you with exceptional heart care, we have created designated Provider Care Teams.  These Care Teams include your primary Cardiologist (physician) and Advanced Practice Providers (APPs -  Physician Assistants and Nurse Practitioners) who all work together to provide you with the care you need, when you need it.   Your next appointment:   6 month(s)  Provider:   Alverda Skeans   Other Instructions Get echocardiogram before next visit in March 2025

## 2023-07-10 DIAGNOSIS — G3184 Mild cognitive impairment, so stated: Secondary | ICD-10-CM | POA: Diagnosis not present

## 2023-07-11 ENCOUNTER — Other Ambulatory Visit: Payer: Self-pay | Admitting: Internal Medicine

## 2023-07-11 DIAGNOSIS — G3184 Mild cognitive impairment, so stated: Secondary | ICD-10-CM

## 2023-07-15 DIAGNOSIS — Z9849 Cataract extraction status, unspecified eye: Secondary | ICD-10-CM | POA: Insufficient documentation

## 2023-07-15 DIAGNOSIS — I499 Cardiac arrhythmia, unspecified: Secondary | ICD-10-CM | POA: Insufficient documentation

## 2023-07-15 DIAGNOSIS — Z86010 Personal history of colonic polyps: Secondary | ICD-10-CM | POA: Insufficient documentation

## 2023-07-15 DIAGNOSIS — K5730 Diverticulosis of large intestine without perforation or abscess without bleeding: Secondary | ICD-10-CM | POA: Insufficient documentation

## 2023-07-15 DIAGNOSIS — G3184 Mild cognitive impairment, so stated: Secondary | ICD-10-CM

## 2023-07-15 DIAGNOSIS — R0602 Shortness of breath: Secondary | ICD-10-CM | POA: Insufficient documentation

## 2023-07-15 HISTORY — DX: Mild cognitive impairment of uncertain or unknown etiology: G31.84

## 2023-07-16 ENCOUNTER — Encounter: Payer: Self-pay | Admitting: Cardiology

## 2023-07-16 ENCOUNTER — Ambulatory Visit: Payer: Medicare PPO | Attending: Cardiology | Admitting: Cardiology

## 2023-07-16 VITALS — BP 118/78 | HR 73 | Ht 66.0 in | Wt 152.0 lb

## 2023-07-16 DIAGNOSIS — I071 Rheumatic tricuspid insufficiency: Secondary | ICD-10-CM

## 2023-07-16 DIAGNOSIS — I4891 Unspecified atrial fibrillation: Secondary | ICD-10-CM | POA: Diagnosis not present

## 2023-07-16 DIAGNOSIS — G3184 Mild cognitive impairment, so stated: Secondary | ICD-10-CM

## 2023-07-16 DIAGNOSIS — E785 Hyperlipidemia, unspecified: Secondary | ICD-10-CM | POA: Diagnosis not present

## 2023-07-16 DIAGNOSIS — I340 Nonrheumatic mitral (valve) insufficiency: Secondary | ICD-10-CM | POA: Diagnosis not present

## 2023-07-16 DIAGNOSIS — I2510 Atherosclerotic heart disease of native coronary artery without angina pectoris: Secondary | ICD-10-CM | POA: Diagnosis not present

## 2023-07-16 NOTE — Patient Instructions (Signed)

## 2023-07-16 NOTE — Progress Notes (Signed)
Cardiology Office Note:    Date:  07/16/2023   ID:  [name] Diaz, DOB 12-Oct-1934, MRN [id number]  PCP:  Thana Ates, MD  Cardiologist:  Garwin Brothers, MD   Referring MD: Marden Noble, MD    ASSESSMENT:    1. Hyperlipidemia, unspecified hyperlipidemia type   2. Atherosclerosis of native coronary artery of native heart without angina pectoris   3. Atrial fibrillation, unspecified type (HCC)   4. Severe mitral regurgitation   5. Severe tricuspid regurgitation   6. Mild cognitive impairment   Longstanding atrial fibrillation on Xarelto Stroke and possible coronary embolism due to atrial fibrillation Stenting of LAD due to possible coronary embolism 2011 Hyperlipidemia intolerant of statins Severe mitral regurgitation (looks to be atrial functional with posterior prolapse) Moderate to severe tricuspid regurgitation DNR/DNI Likely cognitive decline/dementia  PLAN:    In order of problems listed above:  Coronary artery disease: Stable and asymptomatic at this time.  Medical management.  Secondary prevention stressed.  I told her to be active to the best of her ability. Severe mitral regurgitation: I referred to my colleagues notes extensively.  I agree with monitoring closely at this time.  Also her cognitive decline is significant and her friend agrees.  She is planning to go to her assisted living facility in 2 weeks. Atrial fibrillation:I discussed with the patient atrial fibrillation, disease process. Management and therapy including rate and rhythm control, anticoagulation benefits and potential risks were discussed extensively with the patient. Patient had multiple questions which were answered to patient's satisfaction. Patient will be seen in follow-up appointment in 6 months or earlier if the patient has any concerns.    Medication Adjustments/Labs and Tests Ordered: Current medicines are reviewed at length with the patient today.  Concerns regarding medicines are  outlined above.  Orders Placed This Encounter  Procedures   EKG 12-Lead   No orders of the defined types were placed in this encounter.    No chief complaint on file.    History of Present Illness:    [name] Diaz is a 89 y.o. female.  Patient has past medical history of coronary artery disease, mixed dyslipidemia, severe mitral regurgitation, moderate to severe tricuspid regurgitation and cognitive impairment.  She is accompanied by a friend.  She is to going to assisted living because of her cognitive issues.  She was evaluated by my partner who is an interventional list in nonsurgical mitral valve repair.  Because of her mental cognitive decline it was decided to monitor her closely.  And she is very agreeable with this.  She denies any chest pain orthopnea or PND.  She takes care of activities of daily living.  Her BNP was fine recently.  Past Medical History:  Diagnosis Date   A-fib (HCC)    Anticoagulated 03/30/2015   Atherosclerotic heart disease of native coronary artery without angina pectoris 10/19/2021   Bleeding from varicose veins of lower extremity 10/24/2014   Chronic anxiety 07/02/2018   Chronic diastolic heart failure (HCC) 03/29/2014   Colonic diverticular disease    Dysrhythmia    Atrial fib   Edema 03/29/2014   Fibrocystic breast changes 10/19/2021   History of cataract surgery    with lens implant    History of colon polyps    History of colonic diverticulitis 07/02/2018   History of myocardial infarction 10/19/2021   Hypercoagulable state (HCC) 10/19/2021   Hyperlipidemia 02/26/2018   Insomnia 02/26/2022   Lactose intolerance 10/19/2021   Leukocytosis 10/19/2021  Mild cognitive impairment 07/15/2023   Old myocardial infarction 03/29/2014   Pollen allergies 02/26/2022   Postmenopausal 07/02/2018   Reactive depression (situational) 07/02/2018   Shortness of breath    on exertion   Vitamin D deficiency 10/19/2021    Past Surgical History:   Procedure Laterality Date   EYE SURGERY     Bilateral Cataract surgery w/ lens implant     HYSTEROSCOPY WITH D & C  08/25/2012   Procedure: DILATATION AND CURETTAGE /HYSTEROSCOPY;  Surgeon: Geryl Rankins, MD;  Location: [facility];  Service: Gynecology;  Laterality: N/A;  TruClear   VEIN LIGATION  1960s    Current Medications: Current Meds  Medication Sig   Calcium Carbonate-Vitamin D 600-400 MG-UNIT tablet Take 1 tablet by mouth daily.    fish oil-omega-3 fatty acids 1000 MG capsule Take 3 g by mouth daily.   furosemide (LASIX) 20 MG tablet Take 1 tablet (20 mg total) by mouth 2 (two) times daily.   metoprolol tartrate (LOPRESSOR) 100 MG tablet Take 1 tablet (100 mg total) by mouth 2 (two) times daily.   Multiple Vitamin (MULTIVITAMIN WITH MINERALS) TABS Take 1 tablet by mouth daily.   nitroGLYCERIN (NITROSTAT) 0.4 MG SL tablet Place 1 tablet (0.4 mg total) under the tongue every 5 (five) minutes as needed for chest pain.   rivaroxaban (XARELTO) 20 MG TABS tablet Take 1 tablet (20 mg total) by mouth daily with supper.     Allergies:   Clindamycin/lincomycin, Other, Pollen extract, and Simvastatin   Social History   Socioeconomic History   Marital status: Widowed    Spouse name: Not on file   Number of children: Not on file   Years of education: Not on file   Highest education level: Not on file  Occupational History   Not on file  Tobacco Use   Smoking status: Former    Types: Cigarettes   Smokeless tobacco: Never  Substance and Sexual Activity   Alcohol use: No   Drug use: No   Sexual activity: Not on file  Other Topics Concern   Not on file  Social History Narrative   Not on file   Social Determinants of Health   Financial Resource Strain: Not on file  Food Insecurity: Not on file  Transportation Needs: Not on file  Physical Activity: Not on file  Stress: Not on file  Social Connections: Not on file     Family History: The patient's family history includes CVA  in her mother; Heart attack in her brother. There is no history of Breast cancer.  ROS:   Please see the history of present illness.    All other systems reviewed and are negative.  EKGs/Labs/Other Studies Reviewed:    The following studies were reviewed today: .Marland KitchenEKG Interpretation Date/Time:  Wednesday July 16 2023 13:07:47 EDT Ventricular Rate:  73 PR Interval:    QRS Duration:  84 QT Interval:  396 QTC Calculation: 436 R Axis:   139  Text Interpretation: Atrial fibrillation Right axis deviation When compared with ECG of 07-Jul-2023 09:00, No significant change was found Confirmed by Belva Crome [id number]) on 07/16/2023 1:21:41 PM     Recent Labs: 07/19/2022: ALT 22; Hemoglobin 13.3; Platelets 169; TSH 3.420 05/08/2023: BUN 15; Creatinine, Ser 0.73; NT-Pro BNP 708; Potassium 4.6; Sodium 142  Recent Lipid Panel    Component Value Date/Time   CHOL 181 07/19/2022 1203   TRIG 63 07/19/2022 1203   HDL 82 07/19/2022 1203   CHOLHDL 2.2 07/19/2022 1203  CHOLHDL 1.7 03/25/2016 0842   VLDL 10 03/25/2016 0842   LDLCALC 87 07/19/2022 1203    Physical Exam:    VS:  BP 118/78   Pulse 73   Ht 5\' 6"  (1.676 m)   Wt 152 lb 0.6 oz (69 kg)   SpO2 97%   BMI 24.54 kg/m     Wt Readings from Last 3 Encounters:  07/16/23 152 lb 0.6 oz (69 kg)  07/07/23 152 lb 3.2 oz (69 kg)  07/19/22 149 lb 9.6 oz (67.9 kg)     GEN: Patient is in no acute distress HEENT: Normal NECK: No JVD; No carotid bruits LYMPHATICS: No lymphadenopathy CARDIAC: Hear sounds regular, 2/6 systolic murmur at the apex. RESPIRATORY:  Clear to auscultation without rales, wheezing or rhonchi  ABDOMEN: Soft, non-tender, non-distended MUSCULOSKELETAL:  No edema; No deformity  SKIN: Warm and dry NEUROLOGIC:  Alert and oriented x 3 PSYCHIATRIC:  Normal affect   Signed, Garwin Brothers, MD  07/16/2023 1:45 PM    [facility]

## 2023-07-21 ENCOUNTER — Ambulatory Visit: Payer: Medicare PPO | Admitting: Cardiology

## 2023-07-23 ENCOUNTER — Ambulatory Visit: Payer: Medicare PPO | Admitting: Interventional Cardiology

## 2023-07-31 ENCOUNTER — Telehealth: Payer: Self-pay | Admitting: Cardiology

## 2023-07-31 DIAGNOSIS — G3184 Mild cognitive impairment, so stated: Secondary | ICD-10-CM | POA: Diagnosis not present

## 2023-07-31 DIAGNOSIS — Z79899 Other long term (current) drug therapy: Secondary | ICD-10-CM | POA: Diagnosis not present

## 2023-07-31 DIAGNOSIS — I5032 Chronic diastolic (congestive) heart failure: Secondary | ICD-10-CM | POA: Diagnosis not present

## 2023-07-31 DIAGNOSIS — E559 Vitamin D deficiency, unspecified: Secondary | ICD-10-CM | POA: Diagnosis not present

## 2023-07-31 DIAGNOSIS — E782 Mixed hyperlipidemia: Secondary | ICD-10-CM | POA: Diagnosis not present

## 2023-07-31 MED ORDER — FUROSEMIDE 40 MG PO TABS: 40.0000 mg | ORAL_TABLET | Freq: Every day | ORAL | 3 refills | Status: AC

## 2023-07-31 NOTE — Telephone Encounter (Signed)
Pt is taking 40 mg once daily. Med list updated.

## 2023-07-31 NOTE — Telephone Encounter (Signed)
[name] Diaz with [facility] assisted living called to verify what dose of lasix pt should be taking. She also asked if she should be taking it all a once or split it up. Please advise.

## 2023-08-05 DIAGNOSIS — I10 Essential (primary) hypertension: Secondary | ICD-10-CM | POA: Diagnosis not present

## 2023-08-06 ENCOUNTER — Ambulatory Visit
Admission: RE | Admit: 2023-08-06 | Discharge: 2023-08-06 | Disposition: A | Discharge status: Home / Self Care | Payer: Medicare PPO | Source: Ambulatory Visit | Attending: Internal Medicine | Admitting: Internal Medicine

## 2023-08-06 DIAGNOSIS — G3184 Mild cognitive impairment, so stated: Secondary | ICD-10-CM

## 2023-08-06 DIAGNOSIS — R4189 Other symptoms and signs involving cognitive functions and awareness: Secondary | ICD-10-CM | POA: Diagnosis not present

## 2023-08-12 DIAGNOSIS — I5032 Chronic diastolic (congestive) heart failure: Secondary | ICD-10-CM | POA: Diagnosis not present

## 2023-08-12 DIAGNOSIS — R278 Other lack of coordination: Secondary | ICD-10-CM | POA: Diagnosis not present

## 2023-08-12 DIAGNOSIS — G3184 Mild cognitive impairment, so stated: Secondary | ICD-10-CM | POA: Diagnosis not present

## 2023-08-13 DIAGNOSIS — R41841 Cognitive communication deficit: Secondary | ICD-10-CM | POA: Diagnosis not present

## 2023-08-13 DIAGNOSIS — R2681 Unsteadiness on feet: Secondary | ICD-10-CM | POA: Diagnosis not present

## 2023-08-14 ENCOUNTER — Telehealth: Payer: Self-pay | Admitting: Cardiology

## 2023-08-14 DIAGNOSIS — R2681 Unsteadiness on feet: Secondary | ICD-10-CM | POA: Diagnosis not present

## 2023-08-14 NOTE — Telephone Encounter (Signed)
Spoke with Delice Bison at [facility] who states that the pt is concerned with her leg swelling. Pt had an appointment with structural heart but did not show up for her appointment. Staff states her swelling is mild and she had no shortness of breath. Staff has encouraged her to wear compression stockings. Staff will attempt daily weights. Delice Bison verbalized understanding and had no additional questions.

## 2023-08-14 NOTE — Telephone Encounter (Signed)
[facility] called in stating pt is concerned about her legs swelling not going down. She states pt is not having any other symptoms.  Please advise

## 2023-08-15 DIAGNOSIS — R278 Other lack of coordination: Secondary | ICD-10-CM | POA: Diagnosis not present

## 2023-08-15 DIAGNOSIS — I5032 Chronic diastolic (congestive) heart failure: Secondary | ICD-10-CM | POA: Diagnosis not present

## 2023-08-15 DIAGNOSIS — R41841 Cognitive communication deficit: Secondary | ICD-10-CM | POA: Diagnosis not present

## 2023-08-15 DIAGNOSIS — G3184 Mild cognitive impairment, so stated: Secondary | ICD-10-CM | POA: Diagnosis not present

## 2023-08-18 DIAGNOSIS — R278 Other lack of coordination: Secondary | ICD-10-CM | POA: Diagnosis not present

## 2023-08-18 DIAGNOSIS — R41841 Cognitive communication deficit: Secondary | ICD-10-CM | POA: Diagnosis not present

## 2023-08-18 DIAGNOSIS — G3184 Mild cognitive impairment, so stated: Secondary | ICD-10-CM | POA: Diagnosis not present

## 2023-08-18 DIAGNOSIS — I5032 Chronic diastolic (congestive) heart failure: Secondary | ICD-10-CM | POA: Diagnosis not present

## 2023-08-19 DIAGNOSIS — R2681 Unsteadiness on feet: Secondary | ICD-10-CM | POA: Diagnosis not present

## 2023-08-20 DIAGNOSIS — R278 Other lack of coordination: Secondary | ICD-10-CM | POA: Diagnosis not present

## 2023-08-20 DIAGNOSIS — G3184 Mild cognitive impairment, so stated: Secondary | ICD-10-CM | POA: Diagnosis not present

## 2023-08-20 DIAGNOSIS — R41841 Cognitive communication deficit: Secondary | ICD-10-CM | POA: Diagnosis not present

## 2023-08-20 DIAGNOSIS — I5032 Chronic diastolic (congestive) heart failure: Secondary | ICD-10-CM | POA: Diagnosis not present

## 2023-08-22 DIAGNOSIS — R278 Other lack of coordination: Secondary | ICD-10-CM | POA: Diagnosis not present

## 2023-08-22 DIAGNOSIS — I5032 Chronic diastolic (congestive) heart failure: Secondary | ICD-10-CM | POA: Diagnosis not present

## 2023-08-22 DIAGNOSIS — R41841 Cognitive communication deficit: Secondary | ICD-10-CM | POA: Diagnosis not present

## 2023-08-22 DIAGNOSIS — R2681 Unsteadiness on feet: Secondary | ICD-10-CM | POA: Diagnosis not present

## 2023-08-22 DIAGNOSIS — G3184 Mild cognitive impairment, so stated: Secondary | ICD-10-CM | POA: Diagnosis not present

## 2023-08-25 DIAGNOSIS — I5032 Chronic diastolic (congestive) heart failure: Secondary | ICD-10-CM | POA: Diagnosis not present

## 2023-08-25 DIAGNOSIS — R278 Other lack of coordination: Secondary | ICD-10-CM | POA: Diagnosis not present

## 2023-08-25 DIAGNOSIS — G3184 Mild cognitive impairment, so stated: Secondary | ICD-10-CM | POA: Diagnosis not present

## 2023-08-25 DIAGNOSIS — R41841 Cognitive communication deficit: Secondary | ICD-10-CM | POA: Diagnosis not present

## 2023-08-26 DIAGNOSIS — R2681 Unsteadiness on feet: Secondary | ICD-10-CM | POA: Diagnosis not present

## 2023-08-27 DIAGNOSIS — R278 Other lack of coordination: Secondary | ICD-10-CM | POA: Diagnosis not present

## 2023-08-27 DIAGNOSIS — I5032 Chronic diastolic (congestive) heart failure: Secondary | ICD-10-CM | POA: Diagnosis not present

## 2023-08-27 DIAGNOSIS — G3184 Mild cognitive impairment, so stated: Secondary | ICD-10-CM | POA: Diagnosis not present

## 2023-08-28 DIAGNOSIS — R41841 Cognitive communication deficit: Secondary | ICD-10-CM | POA: Diagnosis not present

## 2023-08-29 DIAGNOSIS — G3184 Mild cognitive impairment, so stated: Secondary | ICD-10-CM | POA: Diagnosis not present

## 2023-08-29 DIAGNOSIS — I5032 Chronic diastolic (congestive) heart failure: Secondary | ICD-10-CM | POA: Diagnosis not present

## 2023-08-29 DIAGNOSIS — R41841 Cognitive communication deficit: Secondary | ICD-10-CM | POA: Diagnosis not present

## 2023-08-29 DIAGNOSIS — R2681 Unsteadiness on feet: Secondary | ICD-10-CM | POA: Diagnosis not present

## 2023-08-29 DIAGNOSIS — R278 Other lack of coordination: Secondary | ICD-10-CM | POA: Diagnosis not present

## 2023-09-01 DIAGNOSIS — I5032 Chronic diastolic (congestive) heart failure: Secondary | ICD-10-CM | POA: Diagnosis not present

## 2023-09-01 DIAGNOSIS — G3184 Mild cognitive impairment, so stated: Secondary | ICD-10-CM | POA: Diagnosis not present

## 2023-09-01 DIAGNOSIS — R278 Other lack of coordination: Secondary | ICD-10-CM | POA: Diagnosis not present

## 2023-09-01 DIAGNOSIS — R41841 Cognitive communication deficit: Secondary | ICD-10-CM | POA: Diagnosis not present

## 2023-09-03 DIAGNOSIS — G3184 Mild cognitive impairment, so stated: Secondary | ICD-10-CM | POA: Diagnosis not present

## 2023-09-03 DIAGNOSIS — R2681 Unsteadiness on feet: Secondary | ICD-10-CM | POA: Diagnosis not present

## 2023-09-03 DIAGNOSIS — R278 Other lack of coordination: Secondary | ICD-10-CM | POA: Diagnosis not present

## 2023-09-03 DIAGNOSIS — I5032 Chronic diastolic (congestive) heart failure: Secondary | ICD-10-CM | POA: Diagnosis not present

## 2023-09-04 DIAGNOSIS — I2510 Atherosclerotic heart disease of native coronary artery without angina pectoris: Secondary | ICD-10-CM | POA: Diagnosis not present

## 2023-09-04 DIAGNOSIS — I4891 Unspecified atrial fibrillation: Secondary | ICD-10-CM | POA: Diagnosis not present

## 2023-09-04 DIAGNOSIS — F03B4 Unspecified dementia, moderate, with anxiety: Secondary | ICD-10-CM | POA: Diagnosis not present

## 2023-09-04 DIAGNOSIS — R41841 Cognitive communication deficit: Secondary | ICD-10-CM | POA: Diagnosis not present

## 2023-09-04 DIAGNOSIS — D72829 Elevated white blood cell count, unspecified: Secondary | ICD-10-CM | POA: Diagnosis not present

## 2023-09-04 DIAGNOSIS — F03B11 Unspecified dementia, moderate, with agitation: Secondary | ICD-10-CM | POA: Diagnosis not present

## 2023-09-04 DIAGNOSIS — I872 Venous insufficiency (chronic) (peripheral): Secondary | ICD-10-CM | POA: Diagnosis not present

## 2023-09-04 DIAGNOSIS — I340 Nonrheumatic mitral (valve) insufficiency: Secondary | ICD-10-CM | POA: Diagnosis not present

## 2023-09-04 DIAGNOSIS — I5032 Chronic diastolic (congestive) heart failure: Secondary | ICD-10-CM | POA: Diagnosis not present

## 2023-09-04 DIAGNOSIS — Z7901 Long term (current) use of anticoagulants: Secondary | ICD-10-CM | POA: Diagnosis not present

## 2023-09-05 DIAGNOSIS — R2681 Unsteadiness on feet: Secondary | ICD-10-CM | POA: Diagnosis not present

## 2023-09-05 DIAGNOSIS — R278 Other lack of coordination: Secondary | ICD-10-CM | POA: Diagnosis not present

## 2023-09-05 DIAGNOSIS — R41841 Cognitive communication deficit: Secondary | ICD-10-CM | POA: Diagnosis not present

## 2023-09-05 DIAGNOSIS — G3184 Mild cognitive impairment, so stated: Secondary | ICD-10-CM | POA: Diagnosis not present

## 2023-09-05 DIAGNOSIS — I5032 Chronic diastolic (congestive) heart failure: Secondary | ICD-10-CM | POA: Diagnosis not present

## 2023-09-06 ENCOUNTER — Emergency Department (HOSPITAL_COMMUNITY)
Admission: EM | Admit: 2023-09-06 | Discharge: 2023-09-06 | Disposition: A | Discharge status: Home / Self Care | Payer: Medicare PPO | Attending: Emergency Medicine | Admitting: Emergency Medicine

## 2023-09-06 ENCOUNTER — Encounter (HOSPITAL_COMMUNITY): Payer: Self-pay

## 2023-09-06 ENCOUNTER — Encounter (HOSPITAL_COMMUNITY): Payer: Self-pay | Admitting: *Deleted

## 2023-09-06 ENCOUNTER — Other Ambulatory Visit: Payer: Self-pay

## 2023-09-06 ENCOUNTER — Emergency Department (HOSPITAL_COMMUNITY)
Admission: EM | Admit: 2023-09-06 | Discharge: 2023-09-07 | Disposition: A | Discharge status: Skilled Nursing Facility | Payer: Medicare PPO | Source: Home / Self Care | Attending: Emergency Medicine | Admitting: Emergency Medicine

## 2023-09-06 DIAGNOSIS — I83899 Varicose veins of unspecified lower extremities with other complications: Secondary | ICD-10-CM

## 2023-09-06 DIAGNOSIS — Z7901 Long term (current) use of anticoagulants: Secondary | ICD-10-CM | POA: Diagnosis not present

## 2023-09-06 DIAGNOSIS — I4891 Unspecified atrial fibrillation: Secondary | ICD-10-CM | POA: Diagnosis not present

## 2023-09-06 DIAGNOSIS — I83892 Varicose veins of left lower extremities with other complications: Secondary | ICD-10-CM | POA: Insufficient documentation

## 2023-09-06 DIAGNOSIS — I83813 Varicose veins of bilateral lower extremities with pain: Secondary | ICD-10-CM | POA: Insufficient documentation

## 2023-09-06 DIAGNOSIS — S96901A Unspecified injury of unspecified muscle and tendon at ankle and foot level, right foot, initial encounter: Secondary | ICD-10-CM | POA: Diagnosis not present

## 2023-09-06 DIAGNOSIS — R58 Hemorrhage, not elsewhere classified: Secondary | ICD-10-CM | POA: Diagnosis not present

## 2023-09-06 MED ORDER — LIDOCAINE-EPINEPHRINE-TETRACAINE (LET) TOPICAL GEL
3.0000 mL | Freq: Once | TOPICAL | Status: DC
  Filled 2023-09-06: qty 3

## 2023-09-06 NOTE — Discharge Instructions (Signed)
If bleeding recurs please elevate legs and hold gentle pressure.  Return to the emergency department if any worsening or concerning symptoms.

## 2023-09-06 NOTE — ED Provider Notes (Signed)
[place] EMERGENCY DEPARTMENT AT [facility] Provider Note   CSN: [id number] Arrival date & time: 09/06/23  2125     History {Add pertinent medical, surgical, social history, OB history to HPI:1}  CC:  varicose vein bleeding   [name] Diaz is a 89 y.o. female.  The history is provided by the patient and medical records.   89 y.o. F with history of AFIB on xarelto, hyperlipidemia, depression, presenting to the ED with bleeding from varicose vein in her left foot.  She was seen in the hospital earlier today for same but began bleeding through dressing this evening.  She does not think she injured herself in any way.  She denies any pain.  She is on xarelto and was given dose tonight as scheduled.  Home Medications Prior to Admission medications   Medication Sig Start Date End Date Taking? Authorizing Provider  Calcium Carbonate-Vitamin D 600-400 MG-UNIT tablet Take 1 tablet by mouth daily.     [provider]  fish oil-omega-3 fatty acids 1000 MG capsule Take 3 g by mouth daily.    [provider]  furosemide (LASIX) 40 MG tablet Take 1 tablet (40 mg total) by mouth daily. 07/31/23   Revankar, Aundra Dubin, MD  metoprolol tartrate (LOPRESSOR) 100 MG tablet Take 1 tablet (100 mg total) by mouth 2 (two) times daily. 06/30/23   Corky Crafts, MD  Multiple Vitamin (MULTIVITAMIN WITH MINERALS) TABS Take 1 tablet by mouth daily.    [provider]  nitroGLYCERIN (NITROSTAT) 0.4 MG SL tablet Place 1 tablet (0.4 mg total) under the tongue every 5 (five) minutes as needed for chest pain. 07/11/21   Corky Crafts, MD  rivaroxaban (XARELTO) 20 MG TABS tablet Take 1 tablet (20 mg total) by mouth daily with supper. 06/30/23   Corky Crafts, MD      Allergies    Clindamycin/lincomycin, Other, Pollen extract, and Simvastatin    Review of Systems   Review of Systems  Skin:  Positive for wound.  All other systems reviewed and are  negative.   Physical Exam Updated Vital Signs BP 122/75 (BP Location: Right Arm)   Pulse 84   Temp (!) 97.4 F (36.3 C) (Oral)   Resp 18   Ht 5\' 6"  (1.676 m)   Wt 69 kg   SpO2 100%   BMI 24.55 kg/m   Physical Exam Vitals and nursing note reviewed.  Constitutional:      Appearance: She is well-developed.  HENT:     Head: Normocephalic and atraumatic.  Eyes:     Conjunctiva/sclera: Conjunctivae normal.     Pupils: Pupils are equal, round, and reactive to light.  Cardiovascular:     Rate and Rhythm: Normal rate and regular rhythm.     Heart sounds: Normal heart sounds.  Pulmonary:     Effort: Pulmonary effort is normal.     Breath sounds: Normal breath sounds.  Abdominal:     General: Bowel sounds are normal.     Palpations: Abdomen is soft.  Musculoskeletal:        General: Normal range of motion.     Cervical back: Normal range of motion.     Comments: Pinpoint area of bleeding from varicose vein left lateral foot, no appreciable swelling or deformity, no erythema, no ulceration or other wound present  Skin:    General: Skin is warm and dry.  Neurological:     Mental Status: She is alert and oriented to  person, place, and time.     ED Results / Procedures / Treatments   Labs (all labs ordered are listed, but only abnormal results are displayed) Labs Reviewed - No data to display  EKG None  Radiology No results found.  Procedures Procedures  {Document cardiac monitor, telemetry assessment procedure when appropriate:1}  Medications Ordered in ED Medications  lidocaine-EPINEPHrine-tetracaine (LET) topical gel (has no administration in time range)    ED Course/ Medical Decision Making/ A&P   {   Click here for ABCD2, HEART and other calculatorsREFRESH Note before signing :1}                              Medical Decision Making  89 year old female presenting to the ED with bleeding from varicose vein on her left foot.  She was seen for same earlier  today but began bleeding through the dressing.  She was given her Xarelto tonight.  She has pinpoint area of bleeding from varicose vein left lateral foot.  She has no wound or ulceration.  No significant swelling, no bony deformity.  LET applied along with combat gauze and pressure bandage.  Will reassess.  11:34 PM Patient rechecked, under dressing is clean and dry without blood saturation.  She does not have any evidence of active bleeding at this time.  I recommended that she leave bandage in place for at least 24 hours, keep foot elevated to alleviate blood pooling in her foot.  Also would recommend that she hold her Eliquis for 24 hours.  Can return here for new concerns.  Final Clinical Impression(s) / ED Diagnoses Final diagnoses:  None    Rx / DC Orders ED Discharge Orders     None

## 2023-09-06 NOTE — ED Triage Notes (Signed)
Patient from [facility] with varicose vein left foot that started bleeding this am, patient on xarelto and unaware if any trauma. Bleeding controlled with dressing

## 2023-09-06 NOTE — Discharge Instructions (Signed)
Leave bandage in place for at least 24 hours.  Keep foot elevated. I would hold next dose of xarelto. Follow-up with your primary care doctor. Return here for new concerns.

## 2023-09-06 NOTE — Progress Notes (Signed)
This encounter was created in error - please disregard.

## 2023-09-06 NOTE — ED Notes (Signed)
Noadherent and ace wrap in place.

## 2023-09-06 NOTE — ED Notes (Signed)
Patient refused to use bed pain, refused to let us wheel her to the bathroom with a wheelchair and demanded to walk. MD aware

## 2023-09-06 NOTE — ED Provider Notes (Signed)
[place] EMERGENCY DEPARTMENT AT [facility] Provider Note   CSN: [id number] Arrival date & time: 09/06/23  3086     History  Chief Complaint  Patient presents with   Varicose Veins    [name] Diaz is a 89 y.o. female.  She is brought in by ambulance from home after acute bleeding in her left foot.  She has significant varicose veins and she said it spontaneously started bleeding around 730 this morning.  She is also on a blood thinner Xarelto.  She said this has happened before but not in many years.  She denies any chest pain shortness of breath numbness or weakness.  The history is provided by the patient.  Wound Check This is a new problem. The current episode started 1 to 2 hours ago. The problem has been gradually improving. Pertinent negatives include no chest pain, no abdominal pain, no headaches and no shortness of breath. The symptoms are aggravated by standing. The symptoms are relieved by position. Treatments tried: Being flat and gentle pressure. The treatment provided significant relief.       Home Medications Prior to Admission medications   Medication Sig Start Date End Date Taking? Authorizing Provider  Calcium Carbonate-Vitamin D 600-400 MG-UNIT tablet Take 1 tablet by mouth daily.     [provider]  fish oil-omega-3 fatty acids 1000 MG capsule Take 3 g by mouth daily.    [provider]  furosemide (LASIX) 40 MG tablet Take 1 tablet (40 mg total) by mouth daily. 07/31/23   Revankar, Aundra Dubin, MD  metoprolol tartrate (LOPRESSOR) 100 MG tablet Take 1 tablet (100 mg total) by mouth 2 (two) times daily. 06/30/23   Corky Crafts, MD  Multiple Vitamin (MULTIVITAMIN WITH MINERALS) TABS Take 1 tablet by mouth daily.    [provider]  nitroGLYCERIN (NITROSTAT) 0.4 MG SL tablet Place 1 tablet (0.4 mg total) under the tongue every 5 (five) minutes as needed for chest pain. 07/11/21   Corky Crafts, MD  rivaroxaban  (XARELTO) 20 MG TABS tablet Take 1 tablet (20 mg total) by mouth daily with supper. 06/30/23   Corky Crafts, MD      Allergies    Clindamycin/lincomycin, Other, Pollen extract, and Simvastatin    Review of Systems   Review of Systems  Respiratory:  Negative for shortness of breath.   Cardiovascular:  Negative for chest pain.  Gastrointestinal:  Negative for abdominal pain.  Skin:  Positive for wound.  Neurological:  Negative for headaches.    Physical Exam Updated Vital Signs BP 129/63 (BP Location: Left Arm)   Pulse 69   Temp 98.1 F (36.7 C) (Oral)   Resp 16   SpO2 97%  Physical Exam Constitutional:      Appearance: Normal appearance. She is well-developed.  HENT:     Head: Normocephalic and atraumatic.  Eyes:     Conjunctiva/sclera: Conjunctivae normal.  Musculoskeletal:     Cervical back: Neck supple.     Comments: She has significant bilateral lower extremity varicose veins.  She has good distal pulses motor and sensation.  I currently do not see any signs of ulceration or active bleeding in her left foot.  Skin:    General: Skin is warm and dry.  Neurological:     General: No focal deficit present.     Mental Status: She is alert.     GCS: GCS eye subscore is 4. GCS verbal subscore is 5. GCS motor subscore  is 6.     Sensory: No sensory deficit.     Motor: No weakness.     ED Results / Procedures / Treatments   Labs (all labs ordered are listed, but only abnormal results are displayed) Labs Reviewed - No data to display  EKG None  Radiology No results found.  Procedures Procedures    Medications Ordered in ED Medications - No data to display  ED Course/ Medical Decision Making/ A&P Clinical Course as of 09/06/23 1722  Sat Sep 06, 2023  1040 Reevaluated multiple times and there is no signs of active bleeding.  Will have the nurse put a pressure wrap on her foot and she is waiting for a friend for a ride back to her facility. [MB]     Clinical Course User Index [MB] Terrilee Files, MD                                 Medical Decision Making  This patient complains of bleeding varicose vein; this involves an extensive number of treatment Options and is a complaint that carries with it a high risk of complications and morbidity. The differential includes bleeding varicose vein, ulceration, cellulitis, arterial bleeding Additional history obtained from EMS Previous records obtained and reviewed recent cardiology notes in epic Cardiac monitoring reviewed, sinus rhythm Social determinants considered, no significant barriers Critical Interventions: None  After the interventions stated above, I reevaluated the patient and found hemodynamically stable and no signs of further bleeding Admission and further testing considered, no indications for intervention or further workup at this time.  Patient discharged to home via friend driving.  Foot wrapped up in a mild compression dressing by nurse.         Final Clinical Impression(s) / ED Diagnoses Final diagnoses:  Bleeding from varicose vein    Rx / DC Orders ED Discharge Orders     None         Terrilee Files, MD 09/06/23 1724

## 2023-09-06 NOTE — ED Triage Notes (Signed)
The pt arrived by [facility] from [facility] where the pt resides   earlier today the pt had a varicose vein in her lt foot rupture.  She was seen in this ed and bandaged  the pt reports that her lt foot started to bleed again tonight minimal bleeding on the bandage at present

## 2023-09-07 DIAGNOSIS — R531 Weakness: Secondary | ICD-10-CM | POA: Diagnosis not present

## 2023-09-07 DIAGNOSIS — Z7401 Bed confinement status: Secondary | ICD-10-CM | POA: Diagnosis not present

## 2023-09-07 NOTE — ED Notes (Signed)
Pt in NAD at d/c from ED. Respirations even & unlabored. Skin warm & dry. Bandage in place. Pt discharged with PTAR. Discharge instructions reviewed with facility prior to DC

## 2023-09-07 NOTE — ED Notes (Signed)
Pt complaining of pain to foot, pt states bandage is too tight. Discussed with Allyne Gee PA who states it is okay to loosen bandage some for patient comfort. Bandage loosened and pt continues to elevate foot.

## 2023-09-08 DIAGNOSIS — Z7901 Long term (current) use of anticoagulants: Secondary | ICD-10-CM | POA: Diagnosis not present

## 2023-09-08 DIAGNOSIS — G3184 Mild cognitive impairment, so stated: Secondary | ICD-10-CM | POA: Diagnosis not present

## 2023-09-08 DIAGNOSIS — R278 Other lack of coordination: Secondary | ICD-10-CM | POA: Diagnosis not present

## 2023-09-08 DIAGNOSIS — I5032 Chronic diastolic (congestive) heart failure: Secondary | ICD-10-CM | POA: Diagnosis not present

## 2023-09-08 DIAGNOSIS — F03B11 Unspecified dementia, moderate, with agitation: Secondary | ICD-10-CM | POA: Diagnosis not present

## 2023-09-08 DIAGNOSIS — R41841 Cognitive communication deficit: Secondary | ICD-10-CM | POA: Diagnosis not present

## 2023-09-08 DIAGNOSIS — I872 Venous insufficiency (chronic) (peripheral): Secondary | ICD-10-CM | POA: Diagnosis not present

## 2023-09-09 DIAGNOSIS — I2510 Atherosclerotic heart disease of native coronary artery without angina pectoris: Secondary | ICD-10-CM | POA: Diagnosis not present

## 2023-09-09 DIAGNOSIS — Z7901 Long term (current) use of anticoagulants: Secondary | ICD-10-CM | POA: Diagnosis not present

## 2023-09-09 DIAGNOSIS — I872 Venous insufficiency (chronic) (peripheral): Secondary | ICD-10-CM | POA: Diagnosis not present

## 2023-09-09 DIAGNOSIS — I340 Nonrheumatic mitral (valve) insufficiency: Secondary | ICD-10-CM | POA: Diagnosis not present

## 2023-09-09 DIAGNOSIS — R2681 Unsteadiness on feet: Secondary | ICD-10-CM | POA: Diagnosis not present

## 2023-09-09 DIAGNOSIS — Z9861 Coronary angioplasty status: Secondary | ICD-10-CM | POA: Diagnosis not present

## 2023-09-09 DIAGNOSIS — D72829 Elevated white blood cell count, unspecified: Secondary | ICD-10-CM | POA: Diagnosis not present

## 2023-09-09 DIAGNOSIS — I482 Chronic atrial fibrillation, unspecified: Secondary | ICD-10-CM | POA: Diagnosis not present

## 2023-09-09 DIAGNOSIS — I5032 Chronic diastolic (congestive) heart failure: Secondary | ICD-10-CM | POA: Diagnosis not present

## 2023-09-09 DIAGNOSIS — F03B4 Unspecified dementia, moderate, with anxiety: Secondary | ICD-10-CM | POA: Diagnosis not present

## 2023-09-10 DIAGNOSIS — I5032 Chronic diastolic (congestive) heart failure: Secondary | ICD-10-CM | POA: Diagnosis not present

## 2023-09-10 DIAGNOSIS — G3184 Mild cognitive impairment, so stated: Secondary | ICD-10-CM | POA: Diagnosis not present

## 2023-09-10 DIAGNOSIS — R278 Other lack of coordination: Secondary | ICD-10-CM | POA: Diagnosis not present

## 2023-09-11 DIAGNOSIS — R2681 Unsteadiness on feet: Secondary | ICD-10-CM | POA: Diagnosis not present

## 2023-09-11 DIAGNOSIS — R41841 Cognitive communication deficit: Secondary | ICD-10-CM | POA: Diagnosis not present

## 2023-09-12 DIAGNOSIS — R41841 Cognitive communication deficit: Secondary | ICD-10-CM | POA: Diagnosis not present

## 2023-09-12 DIAGNOSIS — I5032 Chronic diastolic (congestive) heart failure: Secondary | ICD-10-CM | POA: Diagnosis not present

## 2023-09-12 DIAGNOSIS — R278 Other lack of coordination: Secondary | ICD-10-CM | POA: Diagnosis not present

## 2023-09-12 DIAGNOSIS — G3184 Mild cognitive impairment, so stated: Secondary | ICD-10-CM | POA: Diagnosis not present

## 2023-09-15 DIAGNOSIS — R41841 Cognitive communication deficit: Secondary | ICD-10-CM | POA: Diagnosis not present

## 2023-09-15 DIAGNOSIS — G3184 Mild cognitive impairment, so stated: Secondary | ICD-10-CM | POA: Diagnosis not present

## 2023-09-15 DIAGNOSIS — I5032 Chronic diastolic (congestive) heart failure: Secondary | ICD-10-CM | POA: Diagnosis not present

## 2023-09-15 DIAGNOSIS — R2681 Unsteadiness on feet: Secondary | ICD-10-CM | POA: Diagnosis not present

## 2023-09-15 DIAGNOSIS — R278 Other lack of coordination: Secondary | ICD-10-CM | POA: Diagnosis not present

## 2023-09-17 DIAGNOSIS — R278 Other lack of coordination: Secondary | ICD-10-CM | POA: Diagnosis not present

## 2023-09-17 DIAGNOSIS — G3184 Mild cognitive impairment, so stated: Secondary | ICD-10-CM | POA: Diagnosis not present

## 2023-09-17 DIAGNOSIS — M7989 Other specified soft tissue disorders: Secondary | ICD-10-CM | POA: Diagnosis not present

## 2023-09-17 DIAGNOSIS — R2681 Unsteadiness on feet: Secondary | ICD-10-CM | POA: Diagnosis not present

## 2023-09-17 DIAGNOSIS — I5032 Chronic diastolic (congestive) heart failure: Secondary | ICD-10-CM | POA: Diagnosis not present

## 2023-09-19 DIAGNOSIS — I5032 Chronic diastolic (congestive) heart failure: Secondary | ICD-10-CM | POA: Diagnosis not present

## 2023-09-19 DIAGNOSIS — R41841 Cognitive communication deficit: Secondary | ICD-10-CM | POA: Diagnosis not present

## 2023-09-19 DIAGNOSIS — R278 Other lack of coordination: Secondary | ICD-10-CM | POA: Diagnosis not present

## 2023-09-19 DIAGNOSIS — G3184 Mild cognitive impairment, so stated: Secondary | ICD-10-CM | POA: Diagnosis not present

## 2023-09-22 DIAGNOSIS — R41841 Cognitive communication deficit: Secondary | ICD-10-CM | POA: Diagnosis not present

## 2023-09-22 DIAGNOSIS — I5032 Chronic diastolic (congestive) heart failure: Secondary | ICD-10-CM | POA: Diagnosis not present

## 2023-09-22 DIAGNOSIS — R2681 Unsteadiness on feet: Secondary | ICD-10-CM | POA: Diagnosis not present

## 2023-09-22 DIAGNOSIS — R278 Other lack of coordination: Secondary | ICD-10-CM | POA: Diagnosis not present

## 2023-09-22 DIAGNOSIS — G3184 Mild cognitive impairment, so stated: Secondary | ICD-10-CM | POA: Diagnosis not present

## 2023-09-23 DIAGNOSIS — F03A4 Unspecified dementia, mild, with anxiety: Secondary | ICD-10-CM | POA: Diagnosis not present

## 2023-09-23 DIAGNOSIS — R262 Difficulty in walking, not elsewhere classified: Secondary | ICD-10-CM | POA: Diagnosis not present

## 2023-09-23 DIAGNOSIS — Z9181 History of falling: Secondary | ICD-10-CM | POA: Diagnosis not present

## 2023-09-23 DIAGNOSIS — M778 Other enthesopathies, not elsewhere classified: Secondary | ICD-10-CM | POA: Diagnosis not present

## 2023-09-23 DIAGNOSIS — F03A11 Unspecified dementia, mild, with agitation: Secondary | ICD-10-CM | POA: Diagnosis not present

## 2023-09-25 DIAGNOSIS — R278 Other lack of coordination: Secondary | ICD-10-CM | POA: Diagnosis not present

## 2023-09-25 DIAGNOSIS — I5032 Chronic diastolic (congestive) heart failure: Secondary | ICD-10-CM | POA: Diagnosis not present

## 2023-09-25 DIAGNOSIS — G3184 Mild cognitive impairment, so stated: Secondary | ICD-10-CM | POA: Diagnosis not present

## 2023-09-25 DIAGNOSIS — R41841 Cognitive communication deficit: Secondary | ICD-10-CM | POA: Diagnosis not present

## 2023-09-26 DIAGNOSIS — R41841 Cognitive communication deficit: Secondary | ICD-10-CM | POA: Diagnosis not present

## 2023-09-26 DIAGNOSIS — G3184 Mild cognitive impairment, so stated: Secondary | ICD-10-CM | POA: Diagnosis not present

## 2023-09-26 DIAGNOSIS — I5032 Chronic diastolic (congestive) heart failure: Secondary | ICD-10-CM | POA: Diagnosis not present

## 2023-09-26 DIAGNOSIS — R278 Other lack of coordination: Secondary | ICD-10-CM | POA: Diagnosis not present

## 2023-09-29 DIAGNOSIS — R278 Other lack of coordination: Secondary | ICD-10-CM | POA: Diagnosis not present

## 2023-09-29 DIAGNOSIS — I5032 Chronic diastolic (congestive) heart failure: Secondary | ICD-10-CM | POA: Diagnosis not present

## 2023-09-29 DIAGNOSIS — R41841 Cognitive communication deficit: Secondary | ICD-10-CM | POA: Diagnosis not present

## 2023-09-29 DIAGNOSIS — G3184 Mild cognitive impairment, so stated: Secondary | ICD-10-CM | POA: Diagnosis not present

## 2023-10-01 DIAGNOSIS — I5032 Chronic diastolic (congestive) heart failure: Secondary | ICD-10-CM | POA: Diagnosis not present

## 2023-10-01 DIAGNOSIS — R278 Other lack of coordination: Secondary | ICD-10-CM | POA: Diagnosis not present

## 2023-10-01 DIAGNOSIS — G3184 Mild cognitive impairment, so stated: Secondary | ICD-10-CM | POA: Diagnosis not present

## 2023-10-03 DIAGNOSIS — I872 Venous insufficiency (chronic) (peripheral): Secondary | ICD-10-CM | POA: Diagnosis not present

## 2023-10-03 DIAGNOSIS — R278 Other lack of coordination: Secondary | ICD-10-CM | POA: Diagnosis not present

## 2023-10-03 DIAGNOSIS — F01A0 Vascular dementia, mild, without behavioral disturbance, psychotic disturbance, mood disturbance, and anxiety: Secondary | ICD-10-CM | POA: Diagnosis not present

## 2023-10-03 DIAGNOSIS — I5032 Chronic diastolic (congestive) heart failure: Secondary | ICD-10-CM | POA: Diagnosis not present

## 2023-10-03 DIAGNOSIS — G3184 Mild cognitive impairment, so stated: Secondary | ICD-10-CM | POA: Diagnosis not present

## 2023-10-03 DIAGNOSIS — F411 Generalized anxiety disorder: Secondary | ICD-10-CM | POA: Diagnosis not present

## 2023-10-03 DIAGNOSIS — R41841 Cognitive communication deficit: Secondary | ICD-10-CM | POA: Diagnosis not present

## 2023-10-06 DIAGNOSIS — I5032 Chronic diastolic (congestive) heart failure: Secondary | ICD-10-CM | POA: Diagnosis not present

## 2023-10-06 DIAGNOSIS — G3184 Mild cognitive impairment, so stated: Secondary | ICD-10-CM | POA: Diagnosis not present

## 2023-10-06 DIAGNOSIS — R278 Other lack of coordination: Secondary | ICD-10-CM | POA: Diagnosis not present

## 2023-10-06 DIAGNOSIS — R41841 Cognitive communication deficit: Secondary | ICD-10-CM | POA: Diagnosis not present

## 2023-10-08 DIAGNOSIS — R278 Other lack of coordination: Secondary | ICD-10-CM | POA: Diagnosis not present

## 2023-10-08 DIAGNOSIS — G3184 Mild cognitive impairment, so stated: Secondary | ICD-10-CM | POA: Diagnosis not present

## 2023-10-08 DIAGNOSIS — I5032 Chronic diastolic (congestive) heart failure: Secondary | ICD-10-CM | POA: Diagnosis not present

## 2023-10-10 DIAGNOSIS — R278 Other lack of coordination: Secondary | ICD-10-CM | POA: Diagnosis not present

## 2023-10-10 DIAGNOSIS — I5032 Chronic diastolic (congestive) heart failure: Secondary | ICD-10-CM | POA: Diagnosis not present

## 2023-10-10 DIAGNOSIS — G3184 Mild cognitive impairment, so stated: Secondary | ICD-10-CM | POA: Diagnosis not present

## 2023-10-10 DIAGNOSIS — R41841 Cognitive communication deficit: Secondary | ICD-10-CM | POA: Diagnosis not present

## 2023-10-13 DIAGNOSIS — R278 Other lack of coordination: Secondary | ICD-10-CM | POA: Diagnosis not present

## 2023-10-13 DIAGNOSIS — I5032 Chronic diastolic (congestive) heart failure: Secondary | ICD-10-CM | POA: Diagnosis not present

## 2023-10-13 DIAGNOSIS — R41841 Cognitive communication deficit: Secondary | ICD-10-CM | POA: Diagnosis not present

## 2023-10-13 DIAGNOSIS — G3184 Mild cognitive impairment, so stated: Secondary | ICD-10-CM | POA: Diagnosis not present

## 2023-10-16 DIAGNOSIS — I5032 Chronic diastolic (congestive) heart failure: Secondary | ICD-10-CM | POA: Diagnosis not present

## 2023-10-16 DIAGNOSIS — G3184 Mild cognitive impairment, so stated: Secondary | ICD-10-CM | POA: Diagnosis not present

## 2023-10-16 DIAGNOSIS — R278 Other lack of coordination: Secondary | ICD-10-CM | POA: Diagnosis not present

## 2023-10-17 DIAGNOSIS — G3184 Mild cognitive impairment, so stated: Secondary | ICD-10-CM | POA: Diagnosis not present

## 2023-10-17 DIAGNOSIS — I5032 Chronic diastolic (congestive) heart failure: Secondary | ICD-10-CM | POA: Diagnosis not present

## 2023-10-17 DIAGNOSIS — R278 Other lack of coordination: Secondary | ICD-10-CM | POA: Diagnosis not present

## 2023-10-20 DIAGNOSIS — H26491 Other secondary cataract, right eye: Secondary | ICD-10-CM | POA: Diagnosis not present

## 2023-10-20 DIAGNOSIS — H04123 Dry eye syndrome of bilateral lacrimal glands: Secondary | ICD-10-CM | POA: Diagnosis not present

## 2023-10-20 DIAGNOSIS — I5032 Chronic diastolic (congestive) heart failure: Secondary | ICD-10-CM | POA: Diagnosis not present

## 2023-10-20 DIAGNOSIS — H18593 Other hereditary corneal dystrophies, bilateral: Secondary | ICD-10-CM | POA: Diagnosis not present

## 2023-10-20 DIAGNOSIS — Z961 Presence of intraocular lens: Secondary | ICD-10-CM | POA: Diagnosis not present

## 2023-10-20 DIAGNOSIS — G3184 Mild cognitive impairment, so stated: Secondary | ICD-10-CM | POA: Diagnosis not present

## 2023-10-20 DIAGNOSIS — R278 Other lack of coordination: Secondary | ICD-10-CM | POA: Diagnosis not present

## 2023-10-21 DIAGNOSIS — R41841 Cognitive communication deficit: Secondary | ICD-10-CM | POA: Diagnosis not present

## 2023-10-22 DIAGNOSIS — R278 Other lack of coordination: Secondary | ICD-10-CM | POA: Diagnosis not present

## 2023-10-22 DIAGNOSIS — I5032 Chronic diastolic (congestive) heart failure: Secondary | ICD-10-CM | POA: Diagnosis not present

## 2023-10-22 DIAGNOSIS — G3184 Mild cognitive impairment, so stated: Secondary | ICD-10-CM | POA: Diagnosis not present

## 2023-10-23 DIAGNOSIS — R41841 Cognitive communication deficit: Secondary | ICD-10-CM | POA: Diagnosis not present

## 2023-10-24 DIAGNOSIS — G3184 Mild cognitive impairment, so stated: Secondary | ICD-10-CM | POA: Diagnosis not present

## 2023-10-24 DIAGNOSIS — I5032 Chronic diastolic (congestive) heart failure: Secondary | ICD-10-CM | POA: Diagnosis not present

## 2023-10-24 DIAGNOSIS — R41841 Cognitive communication deficit: Secondary | ICD-10-CM | POA: Diagnosis not present

## 2023-10-24 DIAGNOSIS — R278 Other lack of coordination: Secondary | ICD-10-CM | POA: Diagnosis not present

## 2023-10-27 DIAGNOSIS — R278 Other lack of coordination: Secondary | ICD-10-CM | POA: Diagnosis not present

## 2023-10-27 DIAGNOSIS — G3184 Mild cognitive impairment, so stated: Secondary | ICD-10-CM | POA: Diagnosis not present

## 2023-10-27 DIAGNOSIS — R41841 Cognitive communication deficit: Secondary | ICD-10-CM | POA: Diagnosis not present

## 2023-10-27 DIAGNOSIS — I5032 Chronic diastolic (congestive) heart failure: Secondary | ICD-10-CM | POA: Diagnosis not present

## 2023-10-29 DIAGNOSIS — G3184 Mild cognitive impairment, so stated: Secondary | ICD-10-CM | POA: Diagnosis not present

## 2023-10-29 DIAGNOSIS — R278 Other lack of coordination: Secondary | ICD-10-CM | POA: Diagnosis not present

## 2023-10-29 DIAGNOSIS — I5032 Chronic diastolic (congestive) heart failure: Secondary | ICD-10-CM | POA: Diagnosis not present

## 2023-10-31 DIAGNOSIS — F01A0 Vascular dementia, mild, without behavioral disturbance, psychotic disturbance, mood disturbance, and anxiety: Secondary | ICD-10-CM | POA: Diagnosis not present

## 2023-10-31 DIAGNOSIS — R41841 Cognitive communication deficit: Secondary | ICD-10-CM | POA: Diagnosis not present

## 2023-10-31 DIAGNOSIS — F411 Generalized anxiety disorder: Secondary | ICD-10-CM | POA: Diagnosis not present

## 2023-10-31 DIAGNOSIS — R278 Other lack of coordination: Secondary | ICD-10-CM | POA: Diagnosis not present

## 2023-10-31 DIAGNOSIS — I5032 Chronic diastolic (congestive) heart failure: Secondary | ICD-10-CM | POA: Diagnosis not present

## 2023-10-31 DIAGNOSIS — G3184 Mild cognitive impairment, so stated: Secondary | ICD-10-CM | POA: Diagnosis not present

## 2023-10-31 DIAGNOSIS — I872 Venous insufficiency (chronic) (peripheral): Secondary | ICD-10-CM | POA: Diagnosis not present

## 2023-11-03 DIAGNOSIS — I5032 Chronic diastolic (congestive) heart failure: Secondary | ICD-10-CM | POA: Diagnosis not present

## 2023-11-03 DIAGNOSIS — G3184 Mild cognitive impairment, so stated: Secondary | ICD-10-CM | POA: Diagnosis not present

## 2023-11-03 DIAGNOSIS — R41841 Cognitive communication deficit: Secondary | ICD-10-CM | POA: Diagnosis not present

## 2023-11-03 DIAGNOSIS — R278 Other lack of coordination: Secondary | ICD-10-CM | POA: Diagnosis not present

## 2023-11-05 ENCOUNTER — Telehealth: Payer: Self-pay | Admitting: Cardiology

## 2023-11-05 NOTE — Telephone Encounter (Signed)
 Appointment explained and reason for visits. Pt verbalized understanding and had no additional questions.

## 2023-11-05 NOTE — Telephone Encounter (Signed)
 Patient called in confused to why she unable to schedule ann appt with Dr. Revankar for her 9 months f/u. Try to explain that the calendar was not out yet but patient couldn't understand. Then she needed clarity on her march appts and why she had them. Tired to explain again but still unable to understand. Please advise

## 2023-11-06 DIAGNOSIS — R41841 Cognitive communication deficit: Secondary | ICD-10-CM | POA: Diagnosis not present

## 2023-11-07 DIAGNOSIS — R278 Other lack of coordination: Secondary | ICD-10-CM | POA: Diagnosis not present

## 2023-11-07 DIAGNOSIS — R41841 Cognitive communication deficit: Secondary | ICD-10-CM | POA: Diagnosis not present

## 2023-11-07 DIAGNOSIS — G3184 Mild cognitive impairment, so stated: Secondary | ICD-10-CM | POA: Diagnosis not present

## 2023-11-07 DIAGNOSIS — I5032 Chronic diastolic (congestive) heart failure: Secondary | ICD-10-CM | POA: Diagnosis not present

## 2023-11-08 DIAGNOSIS — G3184 Mild cognitive impairment, so stated: Secondary | ICD-10-CM | POA: Diagnosis not present

## 2023-11-08 DIAGNOSIS — I5032 Chronic diastolic (congestive) heart failure: Secondary | ICD-10-CM | POA: Diagnosis not present

## 2023-11-08 DIAGNOSIS — R278 Other lack of coordination: Secondary | ICD-10-CM | POA: Diagnosis not present

## 2023-11-10 DIAGNOSIS — R278 Other lack of coordination: Secondary | ICD-10-CM | POA: Diagnosis not present

## 2023-11-10 DIAGNOSIS — R41841 Cognitive communication deficit: Secondary | ICD-10-CM | POA: Diagnosis not present

## 2023-11-10 DIAGNOSIS — I5032 Chronic diastolic (congestive) heart failure: Secondary | ICD-10-CM | POA: Diagnosis not present

## 2023-11-10 DIAGNOSIS — G3184 Mild cognitive impairment, so stated: Secondary | ICD-10-CM | POA: Diagnosis not present

## 2023-11-13 DIAGNOSIS — R41841 Cognitive communication deficit: Secondary | ICD-10-CM | POA: Diagnosis not present

## 2023-11-14 DIAGNOSIS — R41841 Cognitive communication deficit: Secondary | ICD-10-CM | POA: Diagnosis not present

## 2023-11-17 DIAGNOSIS — G3184 Mild cognitive impairment, so stated: Secondary | ICD-10-CM | POA: Diagnosis not present

## 2023-11-17 DIAGNOSIS — R278 Other lack of coordination: Secondary | ICD-10-CM | POA: Diagnosis not present

## 2023-11-17 DIAGNOSIS — I5032 Chronic diastolic (congestive) heart failure: Secondary | ICD-10-CM | POA: Diagnosis not present

## 2023-11-20 DIAGNOSIS — R41841 Cognitive communication deficit: Secondary | ICD-10-CM | POA: Diagnosis not present

## 2023-11-21 DIAGNOSIS — R41841 Cognitive communication deficit: Secondary | ICD-10-CM | POA: Diagnosis not present

## 2023-11-24 DIAGNOSIS — R278 Other lack of coordination: Secondary | ICD-10-CM | POA: Diagnosis not present

## 2023-11-24 DIAGNOSIS — R41841 Cognitive communication deficit: Secondary | ICD-10-CM | POA: Diagnosis not present

## 2023-11-28 DIAGNOSIS — R41841 Cognitive communication deficit: Secondary | ICD-10-CM | POA: Diagnosis not present

## 2023-12-01 DIAGNOSIS — R41841 Cognitive communication deficit: Secondary | ICD-10-CM | POA: Diagnosis not present

## 2023-12-01 DIAGNOSIS — R278 Other lack of coordination: Secondary | ICD-10-CM | POA: Diagnosis not present

## 2023-12-05 DIAGNOSIS — R41841 Cognitive communication deficit: Secondary | ICD-10-CM | POA: Diagnosis not present

## 2023-12-09 DIAGNOSIS — R41841 Cognitive communication deficit: Secondary | ICD-10-CM | POA: Diagnosis not present

## 2023-12-11 DIAGNOSIS — R41841 Cognitive communication deficit: Secondary | ICD-10-CM | POA: Diagnosis not present

## 2023-12-12 DIAGNOSIS — R41841 Cognitive communication deficit: Secondary | ICD-10-CM | POA: Diagnosis not present

## 2023-12-16 DIAGNOSIS — R41841 Cognitive communication deficit: Secondary | ICD-10-CM | POA: Diagnosis not present

## 2023-12-18 DIAGNOSIS — R41841 Cognitive communication deficit: Secondary | ICD-10-CM | POA: Diagnosis not present

## 2023-12-19 DIAGNOSIS — R41841 Cognitive communication deficit: Secondary | ICD-10-CM | POA: Diagnosis not present

## 2023-12-22 DIAGNOSIS — R41841 Cognitive communication deficit: Secondary | ICD-10-CM | POA: Diagnosis not present

## 2023-12-22 NOTE — Progress Notes (Signed)
 Patient ID: [name] Diaz MRN: [id number] DOB/AGE: 03/17/1934 89 y.o.  Primary Care Physician:Raju, Dorris Singh, MD Primary Cardiologist:  Belva Crome  FOCUSED CARDIOVASCULAR PROBLEM LIST:   Longstanding atrial fibrillation on Xarelto Stroke and possible coronary embolism due to atrial fibrillation Stenting of LAD due to possible coronary embolism 2011 Hyperlipidemia intolerant of statins Severe mitral regurgitation (looks to be atrial functional with posterior prolapse) Moderate to severe tricuspid regurgitation DNR/DNI Likely cognitive decline/dementia CKD stage II   HISTORY OF PRESENT ILLNESS:  September 2024: The patient is a 89 y.o. female with the indicated medical history here for recommendations regarding her mitral regurgitation.  The patient was last seen by Dr. Eldridge Dace about a year ago.  She was doing well from a cardiovascular standpoint.  Due to worsening shortness of breath patient a proBNP which was within normal limits but at the higher range.  Her BMP was also normal.  An echocardiogram demonstrated significant mitral and tricuspid regurgitation.  For this reason she is referred for further recommendations.  She is here with her 2 close friends Aram Beecham and [place].  The patient has been living in an independent living facility for about 5 years.  She is obviously quite forgetful and she has trouble answering my questions about her breathing and her functional capacity.  After several rounds of questioning it is clear that the patient is able to do a fair bit of activity.  She is able to walk around a circular tract at her facility 6 or 7 times without shortness of breath.  She denies any paroxysmal nocturnal dyspnea.  She occasionally gets some peripheral edema which is improved after being supine.  She has not required any hospitalizations or emergency room visits for heart failure issues recently.  She denies any severe bleeding or bruising episodes.  She has had no  recurrent signs or symptoms of stroke.  She has 2 sons that are in the area but she does not see them frequently.  Zella and Aram Beecham are in the vicinity but do not see her on a regular daily basis but they are very close friends of hers.  She is to see her primary care provider who be administering a cognitive function evaluation as there are concerns about progressive dementia.  She tells me she is a DNR/DNI.  Plan: Not a candidate for intervention, follow-up in 6 months with echocardiogram.  March 2025: Patient consents to use of AI scribe. In the interim she was seen in the emergency department in November 2024 for varicose vein bleeding.  She has not required any admissions otherwise.  She has a history of atrial fibrillation, confirmed by a recent EKG as unchanged. An ultrasound also showed leaky heart valves, consistent with previous findings.  She experiences heart palpitations or racing of the heart at night, but these symptoms have not been consistent for the last month or two. No issues with breathing and no recent bleeding while on Xarelto. She denies any problems with her blood thinner.  She recalls being hospitalized in November for bleeding in her legs due to a vein that 'bubbles up every once in a while' and gave way. She notes that the veins in her ankles are 'pretty bad.'  She has a do-not-resuscitate order in place and mentions having Alzheimer's disease, stating she does not wish to live past the point where she can think for herself.  Past Medical History:  Diagnosis Date   A-fib (HCC)    Anticoagulated 03/30/2015  Atherosclerotic heart disease of native coronary artery without angina pectoris 10/19/2021   Bleeding from varicose veins of lower extremity 10/24/2014   Chronic anxiety 07/02/2018   Chronic diastolic heart failure (HCC) 03/29/2014   Colonic diverticular disease    Dysrhythmia    Atrial fib   Edema 03/29/2014   Fibrocystic breast changes 10/19/2021   History of  cataract surgery    with lens implant    History of colon polyps    History of colonic diverticulitis 07/02/2018   History of myocardial infarction 10/19/2021   Hypercoagulable state (HCC) 10/19/2021   Hyperlipidemia 02/26/2018   Insomnia 02/26/2022   Lactose intolerance 10/19/2021   Leukocytosis 10/19/2021   Mild cognitive impairment 07/15/2023   Old myocardial infarction 03/29/2014   Pollen allergies 02/26/2022   Postmenopausal 07/02/2018   Reactive depression (situational) 07/02/2018   Shortness of breath    on exertion   Vitamin D deficiency 10/19/2021    Past Surgical History:  Procedure Laterality Date   EYE SURGERY     Bilateral Cataract surgery w/ lens implant     HYSTEROSCOPY WITH D & C  08/25/2012   Procedure: DILATATION AND CURETTAGE /HYSTEROSCOPY;  Surgeon: Geryl Rankins, MD;  Location: [facility];  Service: Gynecology;  Laterality: N/A;  [employer]   VEIN LIGATION  1960s    Family History  Problem Relation Age of Onset   CVA Mother    Heart attack Brother    Breast cancer Neg Hx     Social History   Socioeconomic History   Marital status: Widowed    Spouse name: Not on file   Number of children: Not on file   Years of education: Not on file   Highest education level: Not on file  Occupational History   Not on file  Tobacco Use   Smoking status: Former    Types: Cigarettes   Smokeless tobacco: Never  Substance and Sexual Activity   Alcohol use: No   Drug use: No   Sexual activity: Not on file  Other Topics Concern   Not on file  Social History Narrative   Not on file   Social Drivers of Health   Financial Resource Strain: Not on file  Food Insecurity: Not on file  Transportation Needs: Not on file  Physical Activity: Not on file  Stress: Not on file  Social Connections: Not on file  Intimate Partner Violence: Not on file     Prior to Admission medications   Medication Sig Start Date End Date Taking? Authorizing Provider  Calcium  Carbonate-Vitamin D 600-400 MG-UNIT tablet Take 1 tablet by mouth daily.     [provider]  fish oil-omega-3 fatty acids 1000 MG capsule Take 3 g by mouth daily.    [provider]  furosemide (LASIX) 20 MG tablet Take 1 tablet (20 mg total) by mouth daily. May take one extra tablet daily as needed for swelling, weight gain or shortness of breath 06/30/23   Corky Crafts, MD  metoprolol tartrate (LOPRESSOR) 100 MG tablet Take 1 tablet (100 mg total) by mouth 2 (two) times daily. 06/30/23   Corky Crafts, MD  Multiple Vitamin (MULTIVITAMIN WITH MINERALS) TABS Take 1 tablet by mouth daily.    [provider]  nitroGLYCERIN (NITROSTAT) 0.4 MG SL tablet Place 1 tablet (0.4 mg total) under the tongue every 5 (five) minutes as needed for chest pain. Patient not taking: Reported on 07/19/2022 07/11/21   Corky Crafts, MD  rivaroxaban Carlena Hurl) 20  MG TABS tablet Take 1 tablet (20 mg total) by mouth daily with supper. 06/30/23   Corky Crafts, MD  sertraline (ZOLOFT) 25 MG tablet Take 25-50 mg by mouth every morning. 07/03/22   [provider]    Allergies  Allergen Reactions   Clindamycin/Lincomycin Diarrhea   Other Diarrhea   Pollen Extract Other (See Comments)    REACTION UNKNOWN   Simvastatin Rash    FACIAL RASH    REVIEW OF SYSTEMS:  General: no fevers/chills/night sweats Eyes: no blurry vision, diplopia, or amaurosis ENT: no sore throat or hearing loss Resp: no cough, wheezing, or hemoptysis CV: no edema or palpitations GI: no abdominal pain, nausea, vomiting, diarrhea, or constipation GU: no dysuria, frequency, or hematuria Skin: no rash Neuro: no headache, numbness, tingling, or weakness of extremities Musculoskeletal: no joint pain or swelling Heme: no bleeding, DVT, or easy bruising Endo: no polydipsia or polyuria  BP 126/64   Pulse 76   Ht 5\' 6"  (1.676 m)   Wt 151 lb 9.6 oz (68.8 kg)   SpO2 98%   BMI 24.47 kg/m    PHYSICAL EXAM: GEN:  AO x 3 in no acute distress HEENT: normal Dentition: Normal Neck: JVP normal. +2 carotid upstrokes without bruits. No thyromegaly. Lungs: equal expansion, clear bilaterally CV: Apex is discrete and nondisplaced, regular rate and rhythm with 2 out of 6 holosystolic murmur best heard at the axilla Abd: soft, non-tender, non-distended; no bruit; positive bowel sounds Ext: no edema, ecchymoses, or cyanosis Vascular: 2+ femoral pulses, 2+ radial pulses       Skin: warm and dry without rash Neuro: CN II-XII grossly intact; motor and sensory grossly intact    DATA AND STUDIES:       Cardiac Studies & Procedures   ______________________________________________________________________________________________   STRESS TESTS  MYOCARDIAL PERFUSION IMAGING 07/21/2019  Narrative  The left ventricular ejection fraction is hyperdynamic (>65%).  Nuclear stress EF: 69%.  No T wave inversion was noted during stress.  There was no ST segment deviation noted during stress.  This is a low risk study.  No reversible ischemia.  LVEF 69% with normal wall motion. Mild RV uptake noted, could suggest elevated pulmonary pressure. This is a low risk study.   ECHOCARDIOGRAM  ECHOCARDIOGRAM COMPLETE 12/24/2023  Narrative ECHOCARDIOGRAM REPORT    Patient Name:   [name] Diaz  Date of Exam: 12/24/2023 Medical Rec #:  [id number]     Height:       66.0 in Accession #:    [id number]    Weight:       152.1 lb Date of Birth:  Jul 09, 1934     BSA:          1.780 m Patient Age:    89 years      BP:           118/74 mmHg Patient Gender: F             HR:           91 bpm. Exam Location:  [address]  Procedure: 2D Echo (Both Spectral and Color Flow Doppler were utilized during procedure).  Indications:    I05.9 Mitral valve disorder  History:        Patient has prior history of Echocardiogram examinations, most recent 05/08/2023. CHF, Previous Myocardial  Infarction, Arrythmias:Atrial Fibrillation, Signs/Symptoms:Shortness of Breath and Edema; Risk Factors:Dyslipidemia and Former Smoker. Cognitive impairment. Mitral regurgitation. Tricuspid regurgitation.  Sonographer:    Cathie Beams RCS Referring Phys:  [id number] Orbie Pyo   Sonographer Comments: Patient became agitated and repeatedly asked how much more time was the test going to take. IMPRESSIONS   1. Left ventricular ejection fraction, by estimation, is 55 to 60%. The left ventricle has normal function. The left ventricle has no regional wall motion abnormalities. Left ventricular diastolic parameters are indeterminate. 2. Right ventricular systolic function is mildly reduced. The right ventricular size is normal. There is severely elevated pulmonary artery systolic pressure. The estimated right ventricular systolic pressure is 67.4 mmHg. 3. Left atrial size was severely dilated. 4. Right atrial size was severely dilated. 5. The mitral valve is abnormal, there is mild bileaflet prolapse and cannot fully rule out a small flail segment. Moderate to severe mitral valve regurgitation, mixed etiology with dilated annulus from severe LAE as well as mild prolapse. The mitral regurgitation is eccentric and does not seem fully visualized, may be severe. No evidence of mitral stenosis. 6. The tricuspid valve is abnormal. Tricuspid valve regurgitation is severe. 7. The aortic valve is tricuspid. Aortic valve regurgitation is mild. No aortic stenosis is present. 8. The inferior vena cava is dilated in size with <50% respiratory variability, suggesting right atrial pressure of 15 mmHg. 9. A small pericardial effusion is present. The pericardial effusion is posterior to the left ventricle. 10. The patient was in atrial fibrillation.  FINDINGS Left Ventricle: Left ventricular ejection fraction, by estimation, is 55 to 60%. The left ventricle has normal function. The left ventricle has no  regional wall motion abnormalities. The left ventricular internal cavity size was normal in size. There is no left ventricular hypertrophy. Left ventricular diastolic parameters are indeterminate.  Right Ventricle: The right ventricular size is normal. No increase in right ventricular wall thickness. Right ventricular systolic function is mildly reduced. There is severely elevated pulmonary artery systolic pressure. The tricuspid regurgitant velocity is 3.62 m/s, and with an assumed right atrial pressure of 15 mmHg, the estimated right ventricular systolic pressure is 67.4 mmHg.  Left Atrium: Left atrial size was severely dilated.  Right Atrium: Right atrial size was severely dilated.  Pericardium: A small pericardial effusion is present. The pericardial effusion is posterior to the left ventricle.  Mitral Valve: The mitral valve is abnormal. There is mild calcification of the mitral valve leaflet(s). Moderate to severe mitral valve regurgitation. No evidence of mitral valve stenosis.  Tricuspid Valve: The tricuspid valve is abnormal. Tricuspid valve regurgitation is severe.  Aortic Valve: The aortic valve is tricuspid. Aortic valve regurgitation is mild. Aortic regurgitation PHT measures 465 msec. No aortic stenosis is present.  Pulmonic Valve: The pulmonic valve was normal in structure. Pulmonic valve regurgitation is mild.  Aorta: The aortic root is normal in size and structure.  Venous: The inferior vena cava is dilated in size with less than 50% respiratory variability, suggesting right atrial pressure of 15 mmHg.  IAS/Shunts: No atrial level shunt detected by color flow Doppler.   LEFT VENTRICLE PLAX 2D LVIDd:         5.10 cm   Diastology LVIDs:         2.60 cm   LV e' lateral: 12.03 cm/s LV PW:         0.70 cm LV IVS:        0.90 cm LVOT diam:     1.80 cm LV SV:         48 LV SV Index:   27 LVOT Area:     2.54 cm   RIGHT VENTRICLE  RV Basal diam:  3.50 cm RV S prime:      7.79 cm/s TAPSE (M-mode): 1.4 cm RVSP:           67.4 mmHg  LEFT ATRIUM              Index        RIGHT ATRIUM            Index LA diam:        5.60 cm  3.15 cm/m   RA Pressure: 15.00 mmHg LA Vol (A2C):   134.0 ml 75.27 ml/m  RA Area:     38.10 cm LA Vol (A4C):   124.0 ml 69.65 ml/m  RA Volume:   155.00 ml  87.07 ml/m LA Biplane Vol: 130.0 ml 73.02 ml/m AORTIC VALVE             PULMONIC VALVE LVOT Vmax:   89.97 cm/s  PR End Diast Vel: 9.65 msec LVOT Vmean:  59.033 cm/s LVOT VTI:    0.188 m AI PHT:      465 msec  AORTA Ao Root diam: 3.00 cm Ao Asc diam:  2.70 cm  MR Peak grad:    77.8 mmHg    TRICUSPID VALVE MR Mean grad:    54.0 mmHg    TR Peak grad:   52.4 mmHg MR Vmax:         441.00 cm/s  TR Vmax:        362.00 cm/s MR Vmean:        348.5 cm/s   Estimated RAP:  15.00 mmHg MR PISA:         3.08 cm     RVSP:           67.4 mmHg MR PISA Eff ROA: 27 mm MR PISA Radius:  0.70 cm      SHUNTS Systemic VTI:  0.19 m Systemic Diam: 1.80 cm  Dalton McleanMD Electronically signed by Wilfred Lacy Signature Date/Time: 12/24/2023/3:02:36 PM    Final          ______________________________________________________________________________________________      05/08/2023: BUN 15; Creatinine, Ser 0.73; NT-Pro BNP 708; Potassium 4.6; Sodium 142   STS RISK CALCULATOR: Pending  NHYA CLASS: 1-2   ASSESSMENT AND PLAN:   Severe mitral regurgitation  Severe tricuspid regurgitation  Persistent atrial fibrillation (HCC)  Secondary hypercoagulable state (HCC)  CKD (chronic kidney disease) stage 2, GFR 60-89 ml/min  Coronary artery disease involving native coronary artery of native heart without angina pectoris  Hyperlipidemia LDL goal <70  MR: Most recent echo demonstrated moderate to severe mitral regurgitation.  Given the patient's dementia and being DNR/DNI as well as her wishes to avoid any invasive procedures we will treat this medically.  The patient will  follow-up with her primary care cardiologist from now on. TR: Most recent echocardiogram demonstrated severe tricuspid regurgitation.  She seems to be tolerating this well.  As above the patient wishes to avoid invasive procedures and given her dementia and DNR DNI status I think this is appropriate.  No further need for structural heart disease follow-up. Atrial fibrillation: Continue Lopressor 100 mg twice daily and Xarelto 20 mg daily. Secondary hypercoagulable state: Continue Xarelto 20 mg daily. CKD stage II: Monitor for now.  In this patient with dementia and advanced age to defer ARB and SGLT2 inhibitor. Coronary artery disease: Continue Xarelto 20 mg daily and Lopressor 1 g twice daily.  Continue as needed nitroglycerin.  Intolerant to statins. Hyperlipidemia: On fish oil.  I have personally reviewed the patients imaging data as summarized above.  I have reviewed the natural history of mitral regurgitation with the patient and family members who are present today. We have discussed the limitations of medical therapy and the poor prognosis associated with symptomatic mitral regurgitation. We have also reviewed potential treatment options, including palliative medical therapy, conventional mitral surgery, and transcatheter mitral edge-to-edge repair. We discussed treatment options in the context of this patient's specific comorbid medical conditions.   All of the patient's questions were answered today. Will make further recommendations based on the results of studies outlined above.   Total time spent with patient today 38 minutes. This includes reviewing records, evaluating the patient and coordinating care.   Orbie Pyo, MD  12/29/2023 10:47 AM    [facility] [address] [place], [place], [place]  [address] Phone: [phone]; Fax: [phone]

## 2023-12-23 DIAGNOSIS — F419 Anxiety disorder, unspecified: Secondary | ICD-10-CM | POA: Diagnosis not present

## 2023-12-23 DIAGNOSIS — F03B4 Unspecified dementia, moderate, with anxiety: Secondary | ICD-10-CM | POA: Diagnosis not present

## 2023-12-24 ENCOUNTER — Ambulatory Visit (HOSPITAL_COMMUNITY): Payer: Medicare PPO | Attending: Cardiology

## 2023-12-24 ENCOUNTER — Encounter: Payer: Self-pay | Admitting: Internal Medicine

## 2023-12-24 DIAGNOSIS — I340 Nonrheumatic mitral (valve) insufficiency: Secondary | ICD-10-CM | POA: Insufficient documentation

## 2023-12-24 DIAGNOSIS — I361 Nonrheumatic tricuspid (valve) insufficiency: Secondary | ICD-10-CM | POA: Insufficient documentation

## 2023-12-24 LAB — ECHOCARDIOGRAM COMPLETE
MV M vel: 4.41 m/s
MV Peak grad: 77.8 mmHg
P 1/2 time: 465 ms
Radius: 0.7 cm
S' Lateral: 2.6 cm

## 2023-12-25 DIAGNOSIS — R41841 Cognitive communication deficit: Secondary | ICD-10-CM | POA: Diagnosis not present

## 2023-12-26 DIAGNOSIS — R41841 Cognitive communication deficit: Secondary | ICD-10-CM | POA: Diagnosis not present

## 2023-12-26 DIAGNOSIS — I872 Venous insufficiency (chronic) (peripheral): Secondary | ICD-10-CM | POA: Diagnosis not present

## 2023-12-26 DIAGNOSIS — F01A0 Vascular dementia, mild, without behavioral disturbance, psychotic disturbance, mood disturbance, and anxiety: Secondary | ICD-10-CM | POA: Diagnosis not present

## 2023-12-26 DIAGNOSIS — F411 Generalized anxiety disorder: Secondary | ICD-10-CM | POA: Diagnosis not present

## 2023-12-29 ENCOUNTER — Encounter: Payer: Self-pay | Admitting: Internal Medicine

## 2023-12-29 ENCOUNTER — Ambulatory Visit: Payer: Medicare PPO | Attending: Internal Medicine | Admitting: Internal Medicine

## 2023-12-29 VITALS — BP 126/64 | HR 76 | Ht 66.0 in | Wt 151.6 lb

## 2023-12-29 DIAGNOSIS — E785 Hyperlipidemia, unspecified: Secondary | ICD-10-CM

## 2023-12-29 DIAGNOSIS — I340 Nonrheumatic mitral (valve) insufficiency: Secondary | ICD-10-CM | POA: Diagnosis not present

## 2023-12-29 DIAGNOSIS — D6869 Other thrombophilia: Secondary | ICD-10-CM

## 2023-12-29 DIAGNOSIS — I2510 Atherosclerotic heart disease of native coronary artery without angina pectoris: Secondary | ICD-10-CM

## 2023-12-29 DIAGNOSIS — N182 Chronic kidney disease, stage 2 (mild): Secondary | ICD-10-CM | POA: Diagnosis not present

## 2023-12-29 DIAGNOSIS — I4819 Other persistent atrial fibrillation: Secondary | ICD-10-CM | POA: Diagnosis not present

## 2023-12-29 DIAGNOSIS — I071 Rheumatic tricuspid insufficiency: Secondary | ICD-10-CM

## 2023-12-29 NOTE — Patient Instructions (Signed)
 Medication Instructions:  Your physician recommends that you continue on your current medications as directed. Please refer to the Current Medication list given to you today.  *If you need a refill on your cardiac medications before your next appointment, please call your pharmacy*  Follow-Up: At [facility], you and your health needs are our priority.  As part of our continuing mission to provide you with exceptional heart care, we have created designated Provider Care Teams.  These Care Teams include your primary Cardiologist (physician) and Advanced Practice Providers (APPs -  Physician Assistants and Nurse Practitioners) who all work together to provide you with the care you need, when you need it.  Your next appointment:   3 month(s)  The format for your next appointment:   In Person  Provider:   Belva Crome, MD{  Other Instructions   1st Floor: - Lobby - Registration  - Pharmacy  - Lab - Cafe  2nd Floor: - PV Lab - Diagnostic Testing (echo, CT, nuclear med)  3rd Floor: - Vacant  4th Floor: - TCTS (cardiothoracic surgery) - AFib Clinic - Structural Heart Clinic - Vascular Surgery  - Vascular Ultrasound  5th Floor: - HeartCare Cardiology (general and EP) - Clinical Pharmacy for coumadin, hypertension, lipid, weight-loss medications, and med management appointments    Valet parking services will be available as well.

## 2024-01-01 DIAGNOSIS — R41841 Cognitive communication deficit: Secondary | ICD-10-CM | POA: Diagnosis not present

## 2024-01-02 DIAGNOSIS — R41841 Cognitive communication deficit: Secondary | ICD-10-CM | POA: Diagnosis not present

## 2024-01-05 DIAGNOSIS — J069 Acute upper respiratory infection, unspecified: Secondary | ICD-10-CM | POA: Diagnosis not present

## 2024-01-05 DIAGNOSIS — R062 Wheezing: Secondary | ICD-10-CM | POA: Diagnosis not present

## 2024-01-07 DIAGNOSIS — R062 Wheezing: Secondary | ICD-10-CM | POA: Diagnosis not present

## 2024-01-07 DIAGNOSIS — I509 Heart failure, unspecified: Secondary | ICD-10-CM | POA: Diagnosis not present

## 2024-01-07 DIAGNOSIS — J069 Acute upper respiratory infection, unspecified: Secondary | ICD-10-CM | POA: Diagnosis not present

## 2024-01-07 DIAGNOSIS — I517 Cardiomegaly: Secondary | ICD-10-CM | POA: Diagnosis not present

## 2024-01-08 DIAGNOSIS — J302 Other seasonal allergic rhinitis: Secondary | ICD-10-CM | POA: Diagnosis not present

## 2024-01-08 DIAGNOSIS — F03A11 Unspecified dementia, mild, with agitation: Secondary | ICD-10-CM | POA: Diagnosis not present

## 2024-01-08 DIAGNOSIS — J208 Acute bronchitis due to other specified organisms: Secondary | ICD-10-CM | POA: Diagnosis not present

## 2024-01-08 DIAGNOSIS — F03A4 Unspecified dementia, mild, with anxiety: Secondary | ICD-10-CM | POA: Diagnosis not present

## 2024-01-15 DIAGNOSIS — R41841 Cognitive communication deficit: Secondary | ICD-10-CM | POA: Diagnosis not present

## 2024-01-15 DIAGNOSIS — F0394 Unspecified dementia, unspecified severity, with anxiety: Secondary | ICD-10-CM | POA: Diagnosis not present

## 2024-01-15 DIAGNOSIS — F419 Anxiety disorder, unspecified: Secondary | ICD-10-CM | POA: Diagnosis not present

## 2024-01-16 DIAGNOSIS — R41841 Cognitive communication deficit: Secondary | ICD-10-CM | POA: Diagnosis not present

## 2024-01-23 DIAGNOSIS — F419 Anxiety disorder, unspecified: Secondary | ICD-10-CM | POA: Diagnosis not present

## 2024-01-23 DIAGNOSIS — F0394 Unspecified dementia, unspecified severity, with anxiety: Secondary | ICD-10-CM | POA: Diagnosis not present

## 2024-02-06 DIAGNOSIS — R319 Hematuria, unspecified: Secondary | ICD-10-CM | POA: Diagnosis not present

## 2024-02-26 DIAGNOSIS — F419 Anxiety disorder, unspecified: Secondary | ICD-10-CM | POA: Diagnosis not present

## 2024-02-26 DIAGNOSIS — Z7901 Long term (current) use of anticoagulants: Secondary | ICD-10-CM | POA: Diagnosis not present

## 2024-02-26 DIAGNOSIS — F0394 Unspecified dementia, unspecified severity, with anxiety: Secondary | ICD-10-CM | POA: Diagnosis not present

## 2024-02-26 DIAGNOSIS — I482 Chronic atrial fibrillation, unspecified: Secondary | ICD-10-CM | POA: Diagnosis not present

## 2024-02-26 DIAGNOSIS — I5032 Chronic diastolic (congestive) heart failure: Secondary | ICD-10-CM | POA: Diagnosis not present

## 2024-03-10 DIAGNOSIS — F411 Generalized anxiety disorder: Secondary | ICD-10-CM | POA: Diagnosis not present

## 2024-03-10 DIAGNOSIS — I872 Venous insufficiency (chronic) (peripheral): Secondary | ICD-10-CM | POA: Diagnosis not present

## 2024-03-10 DIAGNOSIS — F01A0 Vascular dementia, mild, without behavioral disturbance, psychotic disturbance, mood disturbance, and anxiety: Secondary | ICD-10-CM | POA: Diagnosis not present

## 2024-03-11 DIAGNOSIS — I5032 Chronic diastolic (congestive) heart failure: Secondary | ICD-10-CM | POA: Diagnosis not present

## 2024-03-11 DIAGNOSIS — F0394 Unspecified dementia, unspecified severity, with anxiety: Secondary | ICD-10-CM | POA: Diagnosis not present

## 2024-03-11 DIAGNOSIS — F419 Anxiety disorder, unspecified: Secondary | ICD-10-CM | POA: Diagnosis not present

## 2024-03-11 DIAGNOSIS — I482 Chronic atrial fibrillation, unspecified: Secondary | ICD-10-CM | POA: Diagnosis not present

## 2024-03-24 DIAGNOSIS — S9032XA Contusion of left foot, initial encounter: Secondary | ICD-10-CM | POA: Diagnosis not present

## 2024-03-25 DIAGNOSIS — S99922A Unspecified injury of left foot, initial encounter: Secondary | ICD-10-CM | POA: Diagnosis not present

## 2024-03-31 ENCOUNTER — Encounter: Payer: Self-pay | Admitting: Cardiology

## 2024-03-31 ENCOUNTER — Ambulatory Visit: Attending: Cardiology | Admitting: Cardiology

## 2024-03-31 VITALS — BP 126/66 | HR 79 | Ht 66.5 in | Wt 154.0 lb

## 2024-03-31 DIAGNOSIS — I340 Nonrheumatic mitral (valve) insufficiency: Secondary | ICD-10-CM | POA: Diagnosis not present

## 2024-03-31 DIAGNOSIS — I4891 Unspecified atrial fibrillation: Secondary | ICD-10-CM

## 2024-03-31 DIAGNOSIS — I2510 Atherosclerotic heart disease of native coronary artery without angina pectoris: Secondary | ICD-10-CM

## 2024-03-31 NOTE — Progress Notes (Signed)
 Cardiology Office Note:    Date:  03/31/2024   ID:  [name] Diaz, DOB 1934/07/15, MRN [id number]  PCP:  Tena Feeling, MD  Cardiologist:  Nelia Balzarine, MD   Referring MD: Tena Feeling, MD    ASSESSMENT:    1. Atherosclerosis of native coronary artery of native heart without angina pectoris   2. Atrial fibrillation, unspecified type (HCC)   3. Severe mitral regurgitation    PLAN:    In order of problems listed above:  Primary prevention stressed with the patient.  Importance of compliance with diet medication stressed and patient verbalized standing. Atherosclerotic vascular disease: This was discussed with her.  Medical management. Atrial fibrillation:I discussed with the patient atrial fibrillation, disease process. Management and therapy including rate and rhythm control, anticoagulation benefits and potential risks were discussed extensively with the patient. Patient had multiple questions which were answered to patient's satisfaction. Mitral regurgitation: Significant: She has multiple comorbidities and medical management was recommended.  Her comorbidities include an element of dementia.   Patient will be seen in follow-up appointment in 6 months or earlier if the patient has any concerns.    Medication Adjustments/Labs and Tests Ordered: Current medicines are reviewed at length with the patient today.  Concerns regarding medicines are outlined above.  No orders of the defined types were placed in this encounter.  No orders of the defined types were placed in this encounter.    No chief complaint on file.    History of Present Illness:    [name] Diaz is a 89 y.o. female.  Patient has past medical history of atrial fibrillation and moderate to severe mitral regurgitation.  I referred her to our structural heart disease clinic.  She has also issues with cognition.  She denies any chest pain orthopnea or PND.  She tells me that she takes care of activities of daily  living.  She has no problems as such from these activities.  She lives in assisted living.  Her attendant accompanies her.  At the time of my evaluation, the patient is alert awake oriented and in no distress.  Past Medical History:  Diagnosis Date   A-fib (HCC)    Anticoagulated 03/30/2015   Atherosclerotic heart disease of native coronary artery without angina pectoris 10/19/2021   Bleeding from varicose veins of lower extremity 10/24/2014   Chronic anxiety 07/02/2018   Chronic diastolic heart failure (HCC) 03/29/2014   Colonic diverticular disease    Dysrhythmia    Atrial fib   Edema 03/29/2014   Fibrocystic breast changes 10/19/2021   History of cataract surgery    with lens implant    History of colon polyps    History of colonic diverticulitis 07/02/2018   History of myocardial infarction 10/19/2021   Hypercoagulable state (HCC) 10/19/2021   Hyperlipidemia 02/26/2018   Insomnia 02/26/2022   Lactose intolerance 10/19/2021   Leukocytosis 10/19/2021   Mild cognitive impairment 07/15/2023   Old myocardial infarction 03/29/2014   Pollen allergies 02/26/2022   Postmenopausal 07/02/2018   Reactive depression (situational) 07/02/2018   Shortness of breath    on exertion   Vitamin D deficiency 10/19/2021    Past Surgical History:  Procedure Laterality Date   EYE SURGERY     Bilateral Cataract surgery w/ lens implant     HYSTEROSCOPY WITH D & C  08/25/2012   Procedure: DILATATION AND CURETTAGE /HYSTEROSCOPY;  Surgeon: Johnn Najjar, MD;  Location: [facility];  Service: Gynecology;  Laterality: N/A;  Tru[employer]  VEIN LIGATION  1960s    Current Medications: Current Meds  Medication Sig   busPIRone (BUSPAR) 5 MG tablet Take 5 mg by mouth daily.   Calcium  Carbonate-Vitamin D 600-400 MG-UNIT tablet Take 1 tablet by mouth daily.    fish oil-omega-3 fatty acids 1000 MG capsule Take 3 g by mouth daily.   furosemide  (LASIX ) 40 MG tablet Take 1 tablet (40 mg total) by mouth daily.    metoprolol  tartrate (LOPRESSOR ) 100 MG tablet Take 1 tablet (100 mg total) by mouth 2 (two) times daily.   Multiple Vitamin (MULTIVITAMIN WITH MINERALS) TABS Take 1 tablet by mouth daily.   nitroGLYCERIN  (NITROSTAT ) 0.4 MG SL tablet Place 1 tablet (0.4 mg total) under the tongue every 5 (five) minutes as needed for chest pain.   rivaroxaban  (XARELTO ) 20 MG TABS tablet Take 1 tablet (20 mg total) by mouth daily with supper.     Allergies:   Clindamycin/lincomycin, Other, Pollen extract, and Simvastatin    Social History   Socioeconomic History   Marital status: Widowed    Spouse name: Not on file   Number of children: Not on file   Years of education: Not on file   Highest education level: Not on file  Occupational History   Not on file  Tobacco Use   Smoking status: Former    Types: Cigarettes   Smokeless tobacco: Never  Substance and Sexual Activity   Alcohol use: No   Drug use: No   Sexual activity: Not on file  Other Topics Concern   Not on file  Social History Narrative   Not on file   Social Drivers of Health   Financial Resource Strain: Not on file  Food Insecurity: Not on file  Transportation Needs: Not on file  Physical Activity: Not on file  Stress: Not on file  Social Connections: Not on file     Family History: The patient's family history includes CVA in her mother; Heart attack in her brother. There is no history of Breast cancer.  ROS:   Please see the history of present illness.    All other systems reviewed and are negative.  EKGs/Labs/Other Studies Reviewed:    The following studies were reviewed today: I discussed my findings with the patient at length   Recent Labs: 05/08/2023: BUN 15; Creatinine, Ser 0.73; NT-Pro BNP 708; Potassium 4.6; Sodium 142  Recent Lipid Panel    Component Value Date/Time   CHOL 181 07/19/2022 1203   TRIG 63 07/19/2022 1203   HDL 82 07/19/2022 1203   CHOLHDL 2.2 07/19/2022 1203   CHOLHDL 1.7 03/25/2016 0842    VLDL 10 03/25/2016 0842   LDLCALC 87 07/19/2022 1203    Physical Exam:    VS:  BP 126/66   Pulse 79   Ht 5' 6.5 (1.689 m)   Wt 154 lb (69.9 kg)   SpO2 98%   BMI 24.48 kg/m     Wt Readings from Last 3 Encounters:  03/31/24 154 lb (69.9 kg)  12/29/23 151 lb 9.6 oz (68.8 kg)  09/06/23 152 lb 1.9 oz (69 kg)     GEN: Patient is in no acute distress HEENT: Normal NECK: No JVD; No carotid bruits LYMPHATICS: No lymphadenopathy CARDIAC: Hear sounds regular, 2/6 systolic murmur at the apex. RESPIRATORY:  Clear to auscultation without rales, wheezing or rhonchi  ABDOMEN: Soft, non-tender, non-distended MUSCULOSKELETAL:  No edema; No deformity  SKIN: Warm and dry NEUROLOGIC:  Alert and oriented x 3 PSYCHIATRIC:  Normal affect  Signed, Nelia Balzarine, MD  03/31/2024 11:23 AM    [facility]

## 2024-03-31 NOTE — Patient Instructions (Signed)
 Medication Instructions:  Your physician recommends that you continue on your current medications as directed. Please refer to the Current Medication list given to you today.  *If you need a refill on your cardiac medications before your next appointment, please call your pharmacy*  Lab Work: None ordered.  You may go to any [facility] Location for your lab work:  [facility] - [address] ([facility]) - [address] [phone] [address]  [place] - [address]   [place]  - [address]   [place] - [address] - [address] [employer]  - [address] ([facility]   If you have labs (blood work) drawn today and your tests are completely normal, you will receive your results only by: [employer] (if you have MyChart)  If you have any lab test that is abnormal or we need to change your treatment, we will call you or send a MyChart message to review the results.  Testing/Procedures: None ordered.  Follow-Up: At [facility], you and your health needs are our priority.  As part of our continuing mission to provide you with exceptional heart care, we have created designated Provider Care Teams.  These Care Teams include your primary Cardiologist (physician) and Advanced Practice Providers (APPs -  Physician Assistants and Nurse Practitioners) who all work together to provide you with the care you need, when you need it.   Your next appointment:   9 months  The format for your next appointment:   In Person  Provider:   Hillis Lu, MD

## 2024-04-21 DIAGNOSIS — F01A0 Vascular dementia, mild, without behavioral disturbance, psychotic disturbance, mood disturbance, and anxiety: Secondary | ICD-10-CM | POA: Diagnosis not present

## 2024-04-21 DIAGNOSIS — I872 Venous insufficiency (chronic) (peripheral): Secondary | ICD-10-CM | POA: Diagnosis not present

## 2024-04-21 DIAGNOSIS — F411 Generalized anxiety disorder: Secondary | ICD-10-CM | POA: Diagnosis not present

## 2024-06-02 DIAGNOSIS — F411 Generalized anxiety disorder: Secondary | ICD-10-CM | POA: Diagnosis not present

## 2024-06-02 DIAGNOSIS — I872 Venous insufficiency (chronic) (peripheral): Secondary | ICD-10-CM | POA: Diagnosis not present

## 2024-06-02 DIAGNOSIS — F01A0 Vascular dementia, mild, without behavioral disturbance, psychotic disturbance, mood disturbance, and anxiety: Secondary | ICD-10-CM | POA: Diagnosis not present

## 2024-07-26 DIAGNOSIS — F419 Anxiety disorder, unspecified: Secondary | ICD-10-CM | POA: Diagnosis not present

## 2024-07-26 DIAGNOSIS — F0394 Unspecified dementia, unspecified severity, with anxiety: Secondary | ICD-10-CM | POA: Diagnosis not present

## 2024-07-29 DIAGNOSIS — F0394 Unspecified dementia, unspecified severity, with anxiety: Secondary | ICD-10-CM | POA: Diagnosis not present

## 2024-07-29 DIAGNOSIS — F03918 Unspecified dementia, unspecified severity, with other behavioral disturbance: Secondary | ICD-10-CM | POA: Diagnosis not present

## 2024-07-29 DIAGNOSIS — F03911 Unspecified dementia, unspecified severity, with agitation: Secondary | ICD-10-CM | POA: Diagnosis not present

## 2024-07-29 DIAGNOSIS — F419 Anxiety disorder, unspecified: Secondary | ICD-10-CM | POA: Diagnosis not present

## 2024-07-29 DIAGNOSIS — Z9183 Wandering in diseases classified elsewhere: Secondary | ICD-10-CM | POA: Diagnosis not present

## 2024-08-31 DIAGNOSIS — F0394 Unspecified dementia, unspecified severity, with anxiety: Secondary | ICD-10-CM | POA: Diagnosis not present

## 2024-08-31 DIAGNOSIS — I482 Chronic atrial fibrillation, unspecified: Secondary | ICD-10-CM | POA: Diagnosis not present

## 2024-08-31 DIAGNOSIS — I872 Venous insufficiency (chronic) (peripheral): Secondary | ICD-10-CM | POA: Diagnosis not present

## 2024-08-31 DIAGNOSIS — I5032 Chronic diastolic (congestive) heart failure: Secondary | ICD-10-CM | POA: Diagnosis not present

## 2024-08-31 DIAGNOSIS — F03911 Unspecified dementia, unspecified severity, with agitation: Secondary | ICD-10-CM | POA: Diagnosis not present

## 2024-08-31 DIAGNOSIS — Z9181 History of falling: Secondary | ICD-10-CM | POA: Diagnosis not present

## 2024-08-31 DIAGNOSIS — Z7901 Long term (current) use of anticoagulants: Secondary | ICD-10-CM | POA: Diagnosis not present

## 2024-08-31 DIAGNOSIS — I340 Nonrheumatic mitral (valve) insufficiency: Secondary | ICD-10-CM | POA: Diagnosis not present
